# Patient Record
Sex: Male | Born: 1960 | Race: White | Hispanic: No | Marital: Married | Smoking: Former smoker
Health system: Southern US, Community
[De-identification: ages and names within clinical notes are randomized; demographics above are authoritative.]

## PROBLEM LIST (undated history)

## (undated) DIAGNOSIS — C4491 Basal cell carcinoma of skin, unspecified: Secondary | ICD-10-CM

## (undated) DIAGNOSIS — G4700 Insomnia, unspecified: Secondary | ICD-10-CM

## (undated) DIAGNOSIS — J45909 Unspecified asthma, uncomplicated: Secondary | ICD-10-CM

## (undated) DIAGNOSIS — E785 Hyperlipidemia, unspecified: Secondary | ICD-10-CM

## (undated) DIAGNOSIS — R531 Weakness: Secondary | ICD-10-CM

## (undated) DIAGNOSIS — Z87442 Personal history of urinary calculi: Secondary | ICD-10-CM

## (undated) DIAGNOSIS — R350 Frequency of micturition: Secondary | ICD-10-CM

## (undated) DIAGNOSIS — E119 Type 2 diabetes mellitus without complications: Secondary | ICD-10-CM

## (undated) DIAGNOSIS — M542 Cervicalgia: Secondary | ICD-10-CM

## (undated) DIAGNOSIS — M1990 Unspecified osteoarthritis, unspecified site: Secondary | ICD-10-CM

## (undated) DIAGNOSIS — G4730 Sleep apnea, unspecified: Secondary | ICD-10-CM

## (undated) DIAGNOSIS — I10 Essential (primary) hypertension: Secondary | ICD-10-CM

## (undated) DIAGNOSIS — F410 Panic disorder [episodic paroxysmal anxiety] without agoraphobia: Secondary | ICD-10-CM

## (undated) HISTORY — PX: LITHOTRIPSY: SUR834

## (undated) HISTORY — PX: TONSILLECTOMY: SUR1361

## (undated) HISTORY — PX: ARTHROSCOPIC REPAIR ACL: SUR80

## (undated) HISTORY — PX: ANKLE SURGERY: SHX546

---

## 1999-07-12 ENCOUNTER — Encounter: Payer: Self-pay | Admitting: Orthopedic Surgery

## 1999-07-12 ENCOUNTER — Ambulatory Visit (HOSPITAL_COMMUNITY): Admission: RE | Admit: 1999-07-12 | Discharge: 1999-07-12 | Payer: Self-pay | Admitting: Orthopedic Surgery

## 2012-11-10 ENCOUNTER — Emergency Department (HOSPITAL_COMMUNITY): Payer: Worker's Compensation

## 2012-11-10 ENCOUNTER — Inpatient Hospital Stay (HOSPITAL_COMMUNITY)
Admission: EM | Admit: 2012-11-10 | Discharge: 2012-11-16 | DRG: 200 | Disposition: A | Discharge status: Home / Self Care | Payer: Worker's Compensation | Attending: General Surgery | Admitting: General Surgery

## 2012-11-10 ENCOUNTER — Encounter (HOSPITAL_COMMUNITY): Payer: Self-pay

## 2012-11-10 DIAGNOSIS — Y990 Civilian activity done for income or pay: Secondary | ICD-10-CM

## 2012-11-10 DIAGNOSIS — F17200 Nicotine dependence, unspecified, uncomplicated: Secondary | ICD-10-CM | POA: Diagnosis present

## 2012-11-10 DIAGNOSIS — S1093XA Contusion of unspecified part of neck, initial encounter: Secondary | ICD-10-CM | POA: Diagnosis present

## 2012-11-10 DIAGNOSIS — Y92410 Unspecified street and highway as the place of occurrence of the external cause: Secondary | ICD-10-CM

## 2012-11-10 DIAGNOSIS — S2249XA Multiple fractures of ribs, unspecified side, initial encounter for closed fracture: Secondary | ICD-10-CM

## 2012-11-10 DIAGNOSIS — F411 Generalized anxiety disorder: Secondary | ICD-10-CM | POA: Diagnosis present

## 2012-11-10 DIAGNOSIS — IMO0002 Reserved for concepts with insufficient information to code with codable children: Secondary | ICD-10-CM

## 2012-11-10 DIAGNOSIS — T148XXA Other injury of unspecified body region, initial encounter: Secondary | ICD-10-CM

## 2012-11-10 DIAGNOSIS — S2241XA Multiple fractures of ribs, right side, initial encounter for closed fracture: Secondary | ICD-10-CM

## 2012-11-10 DIAGNOSIS — G4733 Obstructive sleep apnea (adult) (pediatric): Secondary | ICD-10-CM | POA: Diagnosis present

## 2012-11-10 DIAGNOSIS — S060X9A Concussion with loss of consciousness of unspecified duration, initial encounter: Secondary | ICD-10-CM

## 2012-11-10 DIAGNOSIS — S272XXA Traumatic hemopneumothorax, initial encounter: Secondary | ICD-10-CM

## 2012-11-10 DIAGNOSIS — Z23 Encounter for immunization: Secondary | ICD-10-CM

## 2012-11-10 DIAGNOSIS — S270XXA Traumatic pneumothorax, initial encounter: Principal | ICD-10-CM | POA: Diagnosis present

## 2012-11-10 DIAGNOSIS — S0003XA Contusion of scalp, initial encounter: Secondary | ICD-10-CM | POA: Diagnosis present

## 2012-11-10 DIAGNOSIS — L259 Unspecified contact dermatitis, unspecified cause: Secondary | ICD-10-CM | POA: Diagnosis present

## 2012-11-10 HISTORY — DX: Sleep apnea, unspecified: G47.30

## 2012-11-10 HISTORY — DX: Unspecified osteoarthritis, unspecified site: M19.90

## 2012-11-10 LAB — URINALYSIS, ROUTINE W REFLEX MICROSCOPIC
Leukocytes, UA: NEGATIVE
Nitrite: NEGATIVE
Protein, ur: 30 mg/dL — AB
Urobilinogen, UA: 0.2 mg/dL (ref 0.0–1.0)

## 2012-11-10 LAB — CBC WITH DIFFERENTIAL/PLATELET
Eosinophils Absolute: 0.2 10*3/uL (ref 0.0–0.7)
HCT: 46.4 % (ref 39.0–52.0)
Hemoglobin: 16.1 g/dL (ref 13.0–17.0)
Lymphs Abs: 1.7 10*3/uL (ref 0.7–4.0)
MCH: 30 pg (ref 26.0–34.0)
MCV: 86.4 fL (ref 78.0–100.0)
Monocytes Absolute: 1.6 10*3/uL — ABNORMAL HIGH (ref 0.1–1.0)
Monocytes Relative: 7 % (ref 3–12)
Neutrophils Relative %: 84 % — ABNORMAL HIGH (ref 43–77)
RBC: 5.37 MIL/uL (ref 4.22–5.81)

## 2012-11-10 LAB — TYPE AND SCREEN
ABO/RH(D): O POS
Antibody Screen: NEGATIVE

## 2012-11-10 LAB — CBC
HCT: 45.7 % (ref 39.0–52.0)
MCH: 29 pg (ref 26.0–34.0)
MCHC: 33.5 g/dL (ref 30.0–36.0)
RDW: 14.1 % (ref 11.5–15.5)

## 2012-11-10 LAB — CK TOTAL AND CKMB (NOT AT ARMC): CK, MB: 4.9 ng/mL — ABNORMAL HIGH (ref 0.3–4.0)

## 2012-11-10 LAB — CREATININE, SERUM: GFR calc non Af Amer: >90 mL/min (ref >90)

## 2012-11-10 LAB — URINE MICROSCOPIC-ADD ON

## 2012-11-10 LAB — POCT I-STAT, CHEM 8
Hemoglobin: 16.7 g/dL (ref 13.0–17.0)
Sodium: 141 mEq/L (ref 135–145)
TCO2: 30 mmol/L (ref 0–100)

## 2012-11-10 LAB — COMPREHENSIVE METABOLIC PANEL
AST: 170 U/L — ABNORMAL HIGH (ref 0–37)
Albumin: 3.9 g/dL (ref 3.5–5.2)
Alkaline Phosphatase: 88 U/L (ref 39–117)
BUN: 17 mg/dL (ref 6–23)
Chloride: 103 mEq/L (ref 96–112)
Potassium: 4.7 mEq/L (ref 3.5–5.1)
Total Bilirubin: 0.2 mg/dL — ABNORMAL LOW (ref 0.3–1.2)

## 2012-11-10 LAB — ETHANOL: Alcohol, Ethyl (B): <11 mg/dL (ref 0–11)

## 2012-11-10 LAB — ABO/RH: ABO/RH(D): O POS

## 2012-11-10 MED ORDER — HYDROCODONE-ACETAMINOPHEN 5-325 MG PO TABS
2.0000 | ORAL_TABLET | ORAL | Status: DC | PRN
  Administered 2012-11-11: 2 via ORAL
  Filled 2012-11-10: qty 2

## 2012-11-10 MED ORDER — ACETAMINOPHEN 325 MG PO TABS: 650.0000 mg | ORAL_TABLET | ORAL | Status: DC | PRN

## 2012-11-10 MED ORDER — MORPHINE SULFATE 4 MG/ML IJ SOLN
4.0000 mg | Freq: Once | INTRAMUSCULAR | Status: AC
  Administered 2012-11-10: 4 mg via INTRAVENOUS
  Filled 2012-11-10 (×2): qty 1

## 2012-11-10 MED ORDER — ENOXAPARIN SODIUM 40 MG/0.4ML ~~LOC~~ SOLN
40.0000 mg | SUBCUTANEOUS | Status: DC
  Administered 2012-11-10: 40 mg via SUBCUTANEOUS
  Filled 2012-11-10 (×2): qty 0.4

## 2012-11-10 MED ORDER — MORPHINE SULFATE 2 MG/ML IJ SOLN
1.0000 mg | INTRAMUSCULAR | Status: DC | PRN
  Administered 2012-11-11: 2 mg via INTRAVENOUS
  Filled 2012-11-10: qty 1

## 2012-11-10 MED ORDER — ONDANSETRON HCL 4 MG/2ML IJ SOLN
4.0000 mg | Freq: Once | INTRAMUSCULAR | Status: AC
  Administered 2012-11-10: 4 mg via INTRAVENOUS
  Filled 2012-11-10: qty 2

## 2012-11-10 MED ORDER — MORPHINE SULFATE 4 MG/ML IJ SOLN
4.0000 mg | Freq: Once | INTRAMUSCULAR | Status: AC
  Administered 2012-11-10: 4 mg via INTRAVENOUS
  Filled 2012-11-10: qty 1

## 2012-11-10 MED ORDER — KCL IN DEXTROSE-NACL 20-5-0.45 MEQ/L-%-% IV SOLN
INTRAVENOUS | Status: DC
  Administered 2012-11-10: 22:00:00 via INTRAVENOUS
  Filled 2012-11-10 (×3): qty 1000

## 2012-11-10 MED ORDER — HYDROCODONE-ACETAMINOPHEN 5-325 MG PO TABS: 1.0000 | ORAL_TABLET | ORAL | Status: DC | PRN

## 2012-11-10 MED ORDER — PANTOPRAZOLE SODIUM 40 MG IV SOLR
40.0000 mg | Freq: Every day | INTRAVENOUS | Status: DC
  Filled 2012-11-10 (×2): qty 40

## 2012-11-10 MED ORDER — ONDANSETRON HCL 4 MG/2ML IJ SOLN
4.0000 mg | Freq: Four times a day (QID) | INTRAMUSCULAR | Status: DC | PRN
Start: 2012-11-10 — End: 2012-11-16

## 2012-11-10 MED ORDER — DOCUSATE SODIUM 100 MG PO CAPS
100.0000 mg | ORAL_CAPSULE | Freq: Two times a day (BID) | ORAL | Status: DC
  Administered 2012-11-10 – 2012-11-14 (×7): 100 mg via ORAL
  Filled 2012-11-10 (×7): qty 1

## 2012-11-10 MED ORDER — IOHEXOL 300 MG/ML  SOLN
80.0000 mL | Freq: Once | INTRAMUSCULAR | Status: AC | PRN
  Administered 2012-11-10: 80 mL via INTRAVENOUS

## 2012-11-10 MED ORDER — PANTOPRAZOLE SODIUM 40 MG PO TBEC
40.0000 mg | DELAYED_RELEASE_TABLET | Freq: Every day | ORAL | Status: DC
  Administered 2012-11-10 – 2012-11-11 (×2): 40 mg via ORAL
  Filled 2012-11-10 (×2): qty 1

## 2012-11-10 MED ORDER — MORPHINE SULFATE 4 MG/ML IJ SOLN: 4.0000 mg | Freq: Once | INTRAMUSCULAR | Status: DC

## 2012-11-10 MED ORDER — SODIUM CHLORIDE 0.9 % IV SOLN
INTRAVENOUS | Status: DC
  Administered 2012-11-10: 16:00:00 via INTRAVENOUS

## 2012-11-10 MED ORDER — ONDANSETRON HCL 4 MG PO TABS: 4.0000 mg | ORAL_TABLET | Freq: Four times a day (QID) | ORAL | Status: DC | PRN

## 2012-11-10 MED ORDER — BISACODYL 10 MG RE SUPP: 10.0000 mg | Freq: Every day | RECTAL | Status: DC | PRN
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
## 2012-11-10 NOTE — ED Notes (Signed)
Pt not requesting any pain medication at the moment. States he is only thirsty.

## 2012-11-10 NOTE — ED Provider Notes (Signed)
Medical screening examination/treatment/procedure(s) were conducted as a shared visit with non-physician practitioner(s) and myself.  I personally evaluated the patient during the encounter   Loren Racer, MD 11/10/12 2303
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
## 2012-11-11 ENCOUNTER — Encounter (HOSPITAL_COMMUNITY): Payer: Self-pay | Admitting: *Deleted

## 2012-11-11 ENCOUNTER — Inpatient Hospital Stay (HOSPITAL_COMMUNITY): Payer: Worker's Compensation

## 2012-11-11 DIAGNOSIS — G4733 Obstructive sleep apnea (adult) (pediatric): Secondary | ICD-10-CM | POA: Insufficient documentation

## 2012-11-11 DIAGNOSIS — S060X9A Concussion with loss of consciousness of unspecified duration, initial encounter: Secondary | ICD-10-CM

## 2012-11-11 DIAGNOSIS — S272XXA Traumatic hemopneumothorax, initial encounter: Secondary | ICD-10-CM

## 2012-11-11 DIAGNOSIS — S2241XA Multiple fractures of ribs, right side, initial encounter for closed fracture: Secondary | ICD-10-CM

## 2012-11-11 LAB — CBC
HCT: 41.3 % (ref 39.0–52.0)
Hemoglobin: 14 g/dL (ref 13.0–17.0)
MCH: 29.3 pg (ref 26.0–34.0)
MCHC: 33.9 g/dL (ref 30.0–36.0)
MCV: 86.4 fL (ref 78.0–100.0)

## 2012-11-11 LAB — BASIC METABOLIC PANEL
BUN: 15 mg/dL (ref 6–23)
CO2: 24 mEq/L (ref 19–32)
Calcium: 8.3 mg/dL — ABNORMAL LOW (ref 8.4–10.5)
Creatinine, Ser: 0.67 mg/dL (ref 0.50–1.35)
GFR calc non Af Amer: >90 mL/min (ref >90)
Glucose, Bld: 206 mg/dL — ABNORMAL HIGH (ref 70–99)
Sodium: 138 mEq/L (ref 135–145)

## 2012-11-11 MED ORDER — ENOXAPARIN SODIUM 30 MG/0.3ML ~~LOC~~ SOLN
30.0000 mg | Freq: Two times a day (BID) | SUBCUTANEOUS | Status: DC
  Administered 2012-11-11 – 2012-11-14 (×6): 30 mg via SUBCUTANEOUS
  Filled 2012-11-11 (×9): qty 0.3

## 2012-11-11 MED ORDER — BACITRACIN ZINC 500 UNIT/GM EX OINT
TOPICAL_OINTMENT | Freq: Two times a day (BID) | CUTANEOUS | Status: DC
  Administered 2012-11-11 – 2012-11-15 (×10): via TOPICAL
  Filled 2012-11-11: qty 15

## 2012-11-11 MED ORDER — GUAIFENESIN ER 600 MG PO TB12
600.0000 mg | ORAL_TABLET | Freq: Two times a day (BID) | ORAL | Status: DC
  Administered 2012-11-11 – 2012-11-14 (×6): 600 mg via ORAL
  Filled 2012-11-11 (×7): qty 1

## 2012-11-11 MED ORDER — HYDROCODONE-ACETAMINOPHEN 10-325 MG PO TABS
0.5000 | ORAL_TABLET | ORAL | Status: DC | PRN
  Administered 2012-11-12 – 2012-11-13 (×3): 1 via ORAL
  Administered 2012-11-13 – 2012-11-14 (×4): 2 via ORAL
  Filled 2012-11-11 (×2): qty 2
  Filled 2012-11-11 (×3): qty 1
  Filled 2012-11-11: qty 2
  Filled 2012-11-11 (×2): qty 1
  Filled 2012-11-11: qty 2
  Filled 2012-11-11: qty 1
  Filled 2012-11-11: qty 2

## 2012-11-11 MED ORDER — LORAZEPAM 1 MG PO TABS
1.0000 mg | ORAL_TABLET | Freq: Three times a day (TID) | ORAL | Status: DC | PRN
  Administered 2012-11-12 – 2012-11-14 (×3): 1 mg via ORAL
  Filled 2012-11-11 (×4): qty 1

## 2012-11-11 MED ORDER — MORPHINE SULFATE 2 MG/ML IJ SOLN
2.0000 mg | INTRAMUSCULAR | Status: DC | PRN
  Administered 2012-11-12 – 2012-11-14 (×3): 2 mg via INTRAVENOUS
  Filled 2012-11-11 (×3): qty 1

## 2012-11-11 MED ORDER — NAPROXEN 500 MG PO TABS
500.0000 mg | ORAL_TABLET | Freq: Two times a day (BID) | ORAL | Status: DC
  Administered 2012-11-11 – 2012-11-16 (×9): 500 mg via ORAL
  Filled 2012-11-11 (×15): qty 1
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
## 2012-11-11 NOTE — Progress Notes (Signed)
Agree.  Wilmon Arms. Corliss Skains, MD, [facility]  11/11/2012 10:52 AM
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
## 2012-11-11 NOTE — Progress Notes (Signed)
Inpatient Diabetes Program Recommendations  AACE/ADA: New Consensus Statement on Inpatient Glycemic Control (2013)  Target Ranges:  Prepandial:   less than 140 mg/dL      Peak postprandial:   less than 180 mg/dL (1-2 hours)      Critically ill patients:  140 - 180 mg/dL   Reason for Assessment: Elevated lab glucose levels.  No history of diabetes noted.  Results for LOYCE, KLASEN (MRN [id number]) as of 11/11/2012 15:44  Ref. Range 11/10/2012 15:52 11/10/2012 16:19 11/11/2012 03:35  Glucose Latest Range: 70-99 mg/dL 409 (H) 811 (H) 914 (H)   Note: Please consider ordering a Hgb A1C and cbg's ac & HS with correction.  Please assess patient for possible new-onset diabetes.  Thank you.  Ruthann Angulo S. Elsie Lincoln, RN, CNS, CDE Inpatient Diabetes Program, team pager [phone]

## 2012-11-11 NOTE — Progress Notes (Signed)
UR completed 

## 2012-11-12 ENCOUNTER — Inpatient Hospital Stay (HOSPITAL_COMMUNITY): Payer: Worker's Compensation

## 2012-11-12 MED ORDER — LIDOCAINE HCL (PF) 1 % IJ SOLN
INTRAMUSCULAR | Status: AC
  Administered 2012-11-12: 11:00:00
  Filled 2012-11-12: qty 5

## 2012-11-12 MED ORDER — MIDAZOLAM HCL 2 MG/2ML IJ SOLN
INTRAMUSCULAR | Status: AC
  Administered 2012-11-12: 2 mg
  Filled 2012-11-12: qty 4

## 2012-11-12 MED ORDER — LIDOCAINE HCL (PF) 1 % IJ SOLN
INTRAMUSCULAR | Status: AC
  Administered 2012-11-12: 10 mg
  Filled 2012-11-12: qty 5

## 2012-11-12 MED ORDER — FENTANYL CITRATE 0.05 MG/ML IJ SOLN
INTRAMUSCULAR | Status: AC
  Administered 2012-11-12: 25 ug
  Filled 2012-11-12: qty 2
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
## 2012-11-12 NOTE — Procedures (Signed)
Right Chest Tube Insertion Procedure Note  Indications:  Clinically significant Pneumothorax  Pre-operative Diagnosis: Pneumothorax  Post-operative Diagnosis: Pneumothorax  Procedure Details  Informed consent was obtained for the procedure, including sedation.  Risks of lung perforation, hemorrhage, arrhythmia, and adverse drug reaction were discussed.   After sterile skin prep, using standard technique with 15mL 1% lidocaine local anesthetic, a 28 French tube was placed in the right anteriolateral below nipple line.  Findings: Good rush of air when entering the pleural space, repeat CXR pending  Estimated Blood Loss:  Minimal        Specimens:  None              Complications:  None; patient tolerated the procedure well.         Disposition: stable to continue ICU         Condition: stable  Attending: Dr. Frederik Schmidt

## 2012-11-12 NOTE — Procedures (Signed)
I was present during the entire procedure.  CT may be in fissure on the right.  This patient has been seen and I agree with the findings and treatment plan.  Marta Lamas. Gae Bon, MD, FACS [phone] (pager) [phone] (direct pager) Trauma Surgeon
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
## 2012-11-12 NOTE — Progress Notes (Signed)
This patient has been seen and I agree with the findings and treatment plan.  Carmilla Granville O. Dalin Caldera, III, MD, FACS [phone] (pager) [phone] (direct pager) Trauma Surgeon  

## 2012-11-13 ENCOUNTER — Inpatient Hospital Stay (HOSPITAL_COMMUNITY): Payer: Worker's Compensation

## 2012-11-13 NOTE — Progress Notes (Signed)
Subjective: Good use of IS  Sore in ribs  Objective: Vital signs in last 24 hours: Temp:  [97.6 F (36.4 C)-99.1 F (37.3 C)] 97.6 F (36.4 C) (03/20 0747) Pulse Rate:  [72-90] 77 (03/20 0747) Resp:  [10-26] 17 (03/20 0747) BP: (107-135)/(58-78) 107/69 mmHg (03/20 0747) SpO2:  [95 %-100 %] 95 % (03/20 0747)    Intake/Output from previous day: 03/19 0701 - 03/20 0700 In: 240 [P.O.:240] Out: 2270 [Urine:2200; Chest Tube:70] Intake/Output this shift:    General appearance: alert Pulmonary:    Diminish BS B   No air leak.  Good air movement. Lab Results:   Recent Labs  11/10/12 2222 11/11/12 0335  WBC 15.0* 12.7*  HGB 15.3 14.0  HCT 45.7 41.3  PLT 208 194   BMET  Recent Labs  11/10/12 1552 11/10/12 1619 11/10/12 2222 11/11/12 0335  NA 138 141  --  138  K 4.7 4.7  --  4.1  CL 103 106  --  105  CO2 27  --   --  24  GLUCOSE 301* 295*  --  206*  BUN 17 18  --  15  CREATININE 0.76 0.90 0.85 0.67  CALCIUM 9.1  --   --  8.3*   PT/INR  Recent Labs  11/10/12 1552  LABPROT 12.2  INR 0.91   ABG No results found for this basename: PHART, PCO2, PO2, HCO3,  in the last 72 hours  Studies/Results: Dg Chest Port 1 View  11/13/2012  *RADIOLOGY REPORT*  Clinical Data: Chest tube evaluation.  PORTABLE CHEST - 1 VIEW  Comparison: Chest x-ray 11/12/2012.  Findings: Right-sided chest tube remains in position with tip inside port over the lower right hemithorax.  Previously noted small right-sided pneumothorax is no longer confidently identified. There are opacities throughout the mid and lower lungs bilaterally which may reflect areas of atelectasis, consolidation or resolving pulmonary contusion.  Small bilateral pleural effusions.  Pulmonary vasculature is within normal limits.  Heart size is borderline enlarged.  Mediastinal contours are distorted by patient positioning.  Subcutaneous emphysema is seen in the right chest wall adjacent to the right chest tube.  IMPRESSION:  1.  Right-sided chest tube appears properly located, and there is now resolution of the previously noted right-sided pneumothorax. The appearance of the chest is otherwise essentially unchanged, as above.   Original Report Authenticated By: Trudie Reed, M.D.    Dg Chest Port 1 View  11/12/2012  *RADIOLOGY REPORT*  Clinical Data: Right chest tube placement.  PORTABLE CHEST - 1 VIEW  Comparison: 11/12/2012.  Findings: Right-sided chest tube has been placed with subcutaneous air right chest wall.  Right apical pneumothorax without significant change approximately 15-20%.  Pulmonary vascular congestion/palmar edema.  Basilar atelectasis.  Prominence of the mediastinal and cardiac silhouette.  IMPRESSION: Right-sided chest tube has been placed with subcutaneous air right chest wall.  Right apical pneumothorax without significant change approximately 15-20%.  Pulmonary vascular congestion/palmar edema.  Basilar atelectasis.  Prominence of the mediastinal and cardiac silhouette.  This is a call report.   Original Report Authenticated By: Lacy Duverney, M.D.    Dg Chest Port 1 View  11/12/2012  *RADIOLOGY REPORT*  Clinical Data: Follow-up evaluation of pneumothorax.  PORTABLE CHEST - 1 VIEW  Comparison: Chest x-ray through 18-1014.  Findings: Slight increase in what is now a moderate sized right- sided pneumothorax (approximately 20% of the volume of the right hemithorax).  Increasing bibasilar opacities (particularly on the right), favored to reflect increasing areas of  atelectasis.  No definite pleural effusions.  Mild pulmonary venous congestion, accentuated by low lung volumes, without frank pulmonary edema. Heart size is normal. The patient is rotated to the left on today's exam, resulting in distortion of the mediastinal contours and reduced diagnostic sensitivity and specificity for mediastinal pathology.  IMPRESSION: 1.  Slight interval increase in size of what is now a moderate sized right-sided  pneumothorax (approximately 20% of the volume of the right hemithorax). 2.  Increasing bibasilar opacities favored to reflect worsening atelectasis, although sequelae of aspiration is also possible.  These results will be called to the ordering clinician or representative by the Radiologist Assistant, and communication documented in the PACS Dashboard.   Original Report Authenticated By: Trudie Reed, M.D.     Anti-infectives: Anti-infectives   None      Assessment/Plan: PHBC  Concussion -- No obvious sequelae  Multiple right rib fractures w/HPTX -- PTX  Multiple abrasions -- Local care  OSA -- on CPAP  FEN -- Tolerating reg diet, SL IV, encourage orals for pain, NSAID  VTE -- SCD's, higher dose Lovenox given weight  Dispo -- CT placed  Lung reexpanded no air leak  To floor     LOS: 3 days    Chania Kochanski A. 11/13/2012

## 2012-11-13 NOTE — Plan of Care (Signed)
Problem: Phase I Progression Outcomes Goal: Pain controlled with appropriate interventions Outcome: Progressing Pain controlled with po analgesia Goal: OOB as tolerated unless otherwise ordered Outcome: Not Progressing Pt is very hesitant to ambulate with continue to encourage Goal: Voiding-avoid urinary catheter unless indicated Outcome: Progressing Pt voiding adaquately Goal: Hemodynamically stable Outcome: Progressing VSS

## 2012-11-14 ENCOUNTER — Inpatient Hospital Stay (HOSPITAL_COMMUNITY): Payer: Worker's Compensation

## 2012-11-14 MED ORDER — TRAMADOL HCL 50 MG PO TABS
50.0000 mg | ORAL_TABLET | Freq: Four times a day (QID) | ORAL | Status: DC | PRN
  Administered 2012-11-14 – 2012-11-15 (×3): 100 mg via ORAL
  Filled 2012-11-14 (×3): qty 2

## 2012-11-14 MED ORDER — HYDROCODONE-ACETAMINOPHEN 10-325 MG PO TABS
2.0000 | ORAL_TABLET | ORAL | Status: DC | PRN
  Administered 2012-11-15 – 2012-11-16 (×2): 2 via ORAL
  Filled 2012-11-14 (×2): qty 2

## 2012-11-14 NOTE — Progress Notes (Signed)
Pt still hurting stating the Ultram "took the edge off" but he's not had much relief. MD on call Cornett made aware, also about rash and d'cd meds earlier today, and orders given to restart Norco.

## 2012-11-14 NOTE — Progress Notes (Signed)
CXR looks okay.  Okay to go to water seal.  This patient has been seen and I agree with the findings and treatment plan.  Marta Lamas. Gae Bon, MD, FACS [phone] (pager) [phone] (direct pager) Trauma Surgeon

## 2012-11-14 NOTE — Progress Notes (Signed)
Patient ID: Edward Santiago, male   DOB: 06/03/1961, 52 y.o.   MRN: [id number]  RN asked me to see pt because of rash on his back. He has a diffuse maculopapular rash over the back and buttocks with some extension onto the flank and hip area with some bullous formation at the right hip. Mildly pruritic, no pain. No prior history of medication allergy. Has had all current medications before except Lovenox and Morphine. Will d/c all current meds except Naproxen (home med) and topical bacitracin. Try tramadol for pain with dilaudid for breakthrough. Monitor rash.   Freeman Caldron, PA-C Pager: [phone] General Trauma PA Pager: [phone]

## 2012-11-14 NOTE — Progress Notes (Signed)
UR completed 

## 2012-11-14 NOTE — Progress Notes (Signed)
Patient ID: JERMERY CARATACHEA, male   DOB: 01-Feb-1961, 52 y.o.   MRN: [id number]    Subjective: Doing well overall, wants to be able to walk more but has chest tube attached to suction, tolerating diet well, sore in ribs and left leg  Objective: Vital signs in last 24 hours: Temp:  [97.7 F (36.5 C)-98.7 F (37.1 C)] 98 F (36.7 C) (03/21 0542) Pulse Rate:  [80-96] 90 (03/21 0542) Resp:  [16-22] 20 (03/21 0542) BP: (115-142)/(69-92) 139/92 mmHg (03/21 0542) SpO2:  [93 %-97 %] 95 % (03/21 0542) Last BM Date: 11/10/12  Intake/Output from previous day: 03/20 0701 - 03/21 0700 In: -  Out: 1520 [Urine:1500; Chest Tube:20] Intake/Output this shift:    General appearance: alert, alert, NAD Heart: RRR Pulmonary: Diminish BS Bilateral   No air leak.  Good air movement. Abd: soft, nontender, +BS   Lab Results:  No results found for this basename: WBC, HGB, HCT, PLT,  in the last 72 hours BMET No results found for this basename: NA, K, CL, CO2, GLUCOSE, BUN, CREATININE, CALCIUM,  in the last 72 hours PT/INR No results found for this basename: LABPROT, INR,  in the last 72 hours ABG No results found for this basename: PHART, PCO2, PO2, HCO3,  in the last 72 hours  Studies/Results: Dg Chest Port 1 View  11/14/2012  *RADIOLOGY REPORT*  Clinical Data: Recent pneumothorax with chest tube placement; pain  PORTABLE CHEST - 1 VIEW  Comparison: November 13, 2012  Findings: Chest tube is positioned on the right with air tracking in the soft tissues along the lateral aspect of the tube, stable. There is no apparent pneumothorax.  Patchy infiltrate in the left base persists.  There is no new opacity.  Heart is upper normal in size with normal pulmonary vascularity.  No adenopathy.  IMPRESSION:   No apparent pneumothorax.  Patchy infiltrate left base, stable.  No new opacity.   Original Report Authenticated By: Bretta Bang, M.D.    Dg Chest Port 1 View  11/13/2012  *RADIOLOGY REPORT*  Clinical  Data: Chest tube evaluation.  PORTABLE CHEST - 1 VIEW  Comparison: Chest x-ray 11/12/2012.  Findings: Right-sided chest tube remains in position with tip inside port over the lower right hemithorax.  Previously noted small right-sided pneumothorax is no longer confidently identified. There are opacities throughout the mid and lower lungs bilaterally which may reflect areas of atelectasis, consolidation or resolving pulmonary contusion.  Small bilateral pleural effusions.  Pulmonary vasculature is within normal limits.  Heart size is borderline enlarged.  Mediastinal contours are distorted by patient positioning.  Subcutaneous emphysema is seen in the right chest wall adjacent to the right chest tube.  IMPRESSION: 1.  Right-sided chest tube appears properly located, and there is now resolution of the previously noted right-sided pneumothorax. The appearance of the chest is otherwise essentially unchanged, as above.   Original Report Authenticated By: Trudie Reed, M.D.    Dg Chest Port 1 View  11/12/2012  *RADIOLOGY REPORT*  Clinical Data: Right chest tube placement.  PORTABLE CHEST - 1 VIEW  Comparison: 11/12/2012.  Findings: Right-sided chest tube has been placed with subcutaneous air right chest wall.  Right apical pneumothorax without significant change approximately 15-20%.  Pulmonary vascular congestion/palmar edema.  Basilar atelectasis.  Prominence of the mediastinal and cardiac silhouette.  IMPRESSION: Right-sided chest tube has been placed with subcutaneous air right chest wall.  Right apical pneumothorax without significant change approximately 15-20%.  Pulmonary vascular congestion/palmar edema.  Basilar  atelectasis.  Prominence of the mediastinal and cardiac silhouette.  This is a call report.   Original Report Authenticated By: Lacy Duverney, M.D.     Anti-infectives: Anti-infectives   None      Assessment/Plan: PHBC  Concussion -- No obvious sequelae  Multiple right rib fractures  w/HPTX -- PTX, x-ray today and yesterday showed resolution of the PTX, will go to water seal today and recheck cxr tomorrow  Multiple abrasions -- Local care  OSA -- on CPAP  FEN -- Tolerating reg diet, SL IV, encourage orals for pain, NSAID  VTE -- SCD's, higher dose Lovenox given weight  Dispo -- PT eval to address any needs, water seal today, ?d/c CT tomorrow, d/c home within the next 24-48 hours.     LOS: 4 days    Otelia Hettinger 11/14/2012

## 2012-11-15 ENCOUNTER — Inpatient Hospital Stay (HOSPITAL_COMMUNITY): Payer: Worker's Compensation

## 2012-11-15 MED ORDER — PSEUDOEPHEDRINE HCL 30 MG PO TABS
30.0000 mg | ORAL_TABLET | Freq: Three times a day (TID) | ORAL | Status: DC | PRN
  Administered 2012-11-15: 30 mg via ORAL
  Filled 2012-11-15 (×2): qty 1

## 2012-11-15 MED ORDER — DIPHENHYDRAMINE HCL 25 MG PO CAPS
25.0000 mg | ORAL_CAPSULE | ORAL | Status: DC | PRN
  Administered 2012-11-15 – 2012-11-16 (×2): 25 mg via ORAL
  Filled 2012-11-15 (×3): qty 1

## 2012-11-15 MED ORDER — DIPHENHYDRAMINE HCL 25 MG PO CAPS
25.0000 mg | ORAL_CAPSULE | Freq: Once | ORAL | Status: AC
  Administered 2012-11-15: 25 mg via ORAL

## 2012-11-15 NOTE — Progress Notes (Signed)
  Subjective: Feeling fine.  Doing well on IS  Objective: Vital signs in last 24 hours: Temp:  [97.9 F (36.6 C)-98.9 F (37.2 C)] 97.9 F (36.6 C) (03/22 0658) Pulse Rate:  [81-100] 81 (03/22 0658) Resp:  [18-20] 18 (03/22 0658) BP: (120-133)/(67-85) 128/85 mmHg (03/22 0658) SpO2:  [94 %-98 %] 98 % (03/22 0658) Last BM Date: 11/10/12  Intake/Output from previous day: 03/21 0701 - 03/22 0700 In: 1000 [P.O.:1000] Out: 1350 [Urine:1250; Chest Tube:100] Intake/Output this shift:    General appearance: alert, cooperative and no distress Resp: clear to auscultation bilaterally and sounds clear today and of serous output from CT, no leak, CXR without ptx, removed at bedside and tolerated well Cardio: normal rate regular GI: soft, non-tender; bowel sounds normal; no masses,  no organomegaly Erythematous and raised rash on back and posterior legs Lab Results:  No results found for this basename: WBC, HGB, HCT, PLT,  in the last 72 hours BMET No results found for this basename: NA, K, CL, CO2, GLUCOSE, BUN, CREATININE, CALCIUM,  in the last 72 hours PT/INR No results found for this basename: LABPROT, INR,  in the last 72 hours ABG No results found for this basename: PHART, PCO2, PO2, HCO3,  in the last 72 hours  Studies/Results: Dg Chest Port 1 View  11/15/2012  *RADIOLOGY REPORT*  Clinical Data: Pneumothorax  PORTABLE CHEST - 1 VIEW  Comparison:   the previous day's study  Findings: Chest remains in the right lung base.  No pneumothorax. Interval decrease in the lateral subcutaneous emphysema.  Bibasilar atelectasis or consolidation, slightly increased on the right since previous exam.  No effusion.  Heart size normal.  IMPRESSION:  1.  Stable right chest tube with no pneumothorax. 2.  Bibasilar atelectasis/consolidation, increased on the right since previous exam.   Original Report Authenticated By: D. Andria Rhein, MD    Dg Chest Port 1 View  11/14/2012  *RADIOLOGY REPORT*   Clinical Data: Recent pneumothorax with chest tube placement; pain  PORTABLE CHEST - 1 VIEW  Comparison: November 13, 2012  Findings: Chest tube is positioned on the right with air tracking in the soft tissues along the lateral aspect of the tube, stable. There is no apparent pneumothorax.  Patchy infiltrate in the left base persists.  There is no new opacity.  Heart is upper normal in size with normal pulmonary vascularity.  No adenopathy.  IMPRESSION:   No apparent pneumothorax.  Patchy infiltrate left base, stable.  No new opacity.   Original Report Authenticated By: Bretta Bang, M.D.     Anti-infectives: Anti-infectives   None      Assessment/Plan: s/p * No surgery found * doing great with IS, CT removed.  will check f/u cxr and likely discharge tomorrow.  he also has rash on back and legs which is in the area of contact with the sheets/bed, probably a contact dermatitis  LOS: 5 days    Lodema Pilot DAVID 11/15/2012

## 2012-11-16 MED ORDER — HYDROCODONE-ACETAMINOPHEN 10-325 MG PO TABS: 1.0000 | ORAL_TABLET | ORAL | Status: DC | PRN

## 2012-11-16 NOTE — Progress Notes (Signed)
  Subjective: "Ready to go home", hasnt had pain meds since last night  Objective: Vital signs in last 24 hours: Temp:  [98.2 F (36.8 C)-98.7 F (37.1 C)] 98.2 F (36.8 C) (03/23 0549) Pulse Rate:  [79-85] 83 (03/23 0549) Resp:  [18-20] 18 (03/23 0549) BP: (116-135)/(70-77) 128/77 mmHg (03/23 0549) SpO2:  [95 %] 95 % (03/23 0549) Last BM Date: 11/10/12  Intake/Output from previous day:   Intake/Output this shift:    General appearance: alert, cooperative and no distress Resp: clear to auscultation bilaterally Cardio: normal rate Skin: rash on backside minimally changed, not worse  Lab Results:  No results found for this basename: WBC, HGB, HCT, PLT,  in the last 72 hours BMET No results found for this basename: NA, K, CL, CO2, GLUCOSE, BUN, CREATININE, CALCIUM,  in the last 72 hours PT/INR No results found for this basename: LABPROT, INR,  in the last 72 hours ABG No results found for this basename: PHART, PCO2, PO2, HCO3,  in the last 72 hours  Studies/Results: Dg Chest 2 View  11/15/2012  *RADIOLOGY REPORT*  Clinical Data: Chest tube removal  CHEST - 2 VIEW  Comparison: 11/15/2012  Findings: Right chest tube has been removed.  No pneumothorax. There is gas in the chest wall on the right, unchanged.  Improved lung volume.  There remains bibasilar atelectasis with slight improvement.  No heart failure or effusion.  IMPRESSION: No pneumothorax post right chest tube removal.  Improved aeration in the lungs.   Original Report Authenticated By: Janeece Riggers, M.D.    Dg Knee 1-2 Views Left  11/15/2012  *RADIOLOGY REPORT*  Clinical Data: Pain  LEFT KNEE - 1-2 VIEW  Comparison: None.  Findings: Normal alignment and no fracture.  Medial and lateral joint spaces are normal.  There is mild spurring of the superior pole patella.  There is a joint effusion.  IMPRESSION: Joint effusion without fracture.   Original Report Authenticated By: Janeece Riggers, M.D.    Dg Chest Port 1  View  11/15/2012  *RADIOLOGY REPORT*  Clinical Data: Pneumothorax  PORTABLE CHEST - 1 VIEW  Comparison:   the previous day's study  Findings: Chest remains in the right lung base.  No pneumothorax. Interval decrease in the lateral subcutaneous emphysema.  Bibasilar atelectasis or consolidation, slightly increased on the right since previous exam.  No effusion.  Heart size normal.  IMPRESSION:  1.  Stable right chest tube with no pneumothorax. 2.  Bibasilar atelectasis/consolidation, increased on the right since previous exam.   Original Report Authenticated By: D. Andria Rhein, MD     Anti-infectives: Anti-infectives   None      Assessment/Plan: s/p * No surgery found * I think that he should be okay for discharge.  xray looks okay, breathing well, no SOB. pain controlled.  he should be okay for discharge with repeat CXR and f/u with primary care doc for apparent contact dermatitis  LOS: 6 days    Lodema Pilot DAVID 11/16/2012

## 2012-11-16 NOTE — Progress Notes (Signed)
Patient places CPAP on himself. Tolerating well at this time. RT will continue to monitor.
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
## 2012-11-27 ENCOUNTER — Telehealth (HOSPITAL_COMMUNITY): Payer: Self-pay | Admitting: Emergency Medicine

## 2012-11-27 DIAGNOSIS — S270XXD Traumatic pneumothorax, subsequent encounter: Secondary | ICD-10-CM

## 2012-11-27 NOTE — Telephone Encounter (Signed)
Ordered CXR for pt.

## 2012-11-28 ENCOUNTER — Ambulatory Visit (HOSPITAL_BASED_OUTPATIENT_CLINIC_OR_DEPARTMENT_OTHER)
Admission: RE | Admit: 2012-11-28 | Discharge: 2012-11-28 | Disposition: A | Discharge status: Home / Self Care | Payer: Worker's Compensation | Source: Ambulatory Visit | Attending: Orthopedic Surgery | Admitting: Orthopedic Surgery

## 2012-11-28 DIAGNOSIS — J90 Pleural effusion, not elsewhere classified: Secondary | ICD-10-CM | POA: Insufficient documentation

## 2012-11-28 DIAGNOSIS — S270XXD Traumatic pneumothorax, subsequent encounter: Secondary | ICD-10-CM

## 2012-12-01 ENCOUNTER — Telehealth (INDEPENDENT_AMBULATORY_CARE_PROVIDER_SITE_OTHER): Payer: Self-pay | Admitting: Orthopedic Surgery

## 2012-12-01 NOTE — Telephone Encounter (Signed)
Left message about CXR results.

## 2013-06-13 ENCOUNTER — Encounter (HOSPITAL_BASED_OUTPATIENT_CLINIC_OR_DEPARTMENT_OTHER): Payer: Self-pay | Admitting: Emergency Medicine

## 2013-06-13 ENCOUNTER — Emergency Department (HOSPITAL_BASED_OUTPATIENT_CLINIC_OR_DEPARTMENT_OTHER)
Admission: EM | Admit: 2013-06-13 | Discharge: 2013-06-13 | Disposition: A | Discharge status: Home / Self Care | Payer: BC Managed Care – PPO | Attending: Emergency Medicine | Admitting: Emergency Medicine

## 2013-06-13 DIAGNOSIS — Z791 Long term (current) use of non-steroidal anti-inflammatories (NSAID): Secondary | ICD-10-CM | POA: Insufficient documentation

## 2013-06-13 DIAGNOSIS — Z8781 Personal history of (healed) traumatic fracture: Secondary | ICD-10-CM | POA: Insufficient documentation

## 2013-06-13 DIAGNOSIS — F17200 Nicotine dependence, unspecified, uncomplicated: Secondary | ICD-10-CM | POA: Insufficient documentation

## 2013-06-13 DIAGNOSIS — M129 Arthropathy, unspecified: Secondary | ICD-10-CM | POA: Insufficient documentation

## 2013-06-13 DIAGNOSIS — R7309 Other abnormal glucose: Secondary | ICD-10-CM | POA: Insufficient documentation

## 2013-06-13 DIAGNOSIS — N390 Urinary tract infection, site not specified: Secondary | ICD-10-CM | POA: Insufficient documentation

## 2013-06-13 DIAGNOSIS — Z8709 Personal history of other diseases of the respiratory system: Secondary | ICD-10-CM | POA: Insufficient documentation

## 2013-06-13 LAB — URINALYSIS, ROUTINE W REFLEX MICROSCOPIC
Glucose, UA: NEGATIVE mg/dL
Nitrite: NEGATIVE
Specific Gravity, Urine: 1.027 (ref 1.005–1.030)
pH: 5 (ref 5.0–8.0)

## 2013-06-13 LAB — GLUCOSE, CAPILLARY: Glucose-Capillary: 192 mg/dL — ABNORMAL HIGH (ref 70–99)

## 2013-06-13 LAB — URINE MICROSCOPIC-ADD ON

## 2013-06-13 MED ORDER — FLUCONAZOLE 200 MG PO TABS: 200.0000 mg | ORAL_TABLET | Freq: Every day | ORAL | Status: DC

## 2013-06-13 MED ORDER — CEPHALEXIN 500 MG PO CAPS: 500.0000 mg | ORAL_CAPSULE | Freq: Two times a day (BID) | ORAL | Status: DC
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
## 2013-06-13 NOTE — ED Notes (Signed)
Pt having lower abdominal pain with hematuria for several days.  No N/V/D.  No known fever.

## 2013-06-15 LAB — URINE CULTURE

## 2013-06-28 NOTE — ED Provider Notes (Addendum)
I saw and evaluated the patient, reviewed the resident's note and I agree with the findings and plan.  EKG Interpretation   None       Hurman Horn, MD 06/28/13 1904  Hurman Horn, MD 06/28/13 [id number]

## 2014-11-25 ENCOUNTER — Other Ambulatory Visit: Payer: Self-pay | Admitting: Orthopedic Surgery

## 2014-12-01 ENCOUNTER — Other Ambulatory Visit: Payer: Self-pay | Admitting: Orthopedic Surgery

## 2014-12-08 ENCOUNTER — Encounter (HOSPITAL_COMMUNITY)
Admission: RE | Admit: 2014-12-08 | Discharge: 2014-12-08 | Disposition: A | Discharge status: Home / Self Care | Payer: No Typology Code available for payment source | Source: Ambulatory Visit | Attending: Orthopedic Surgery | Admitting: Orthopedic Surgery

## 2014-12-08 ENCOUNTER — Encounter (HOSPITAL_COMMUNITY): Payer: Self-pay

## 2014-12-08 DIAGNOSIS — Z01812 Encounter for preprocedural laboratory examination: Secondary | ICD-10-CM | POA: Insufficient documentation

## 2014-12-08 DIAGNOSIS — Z01818 Encounter for other preprocedural examination: Secondary | ICD-10-CM | POA: Insufficient documentation

## 2014-12-08 DIAGNOSIS — Z01810 Encounter for preprocedural cardiovascular examination: Secondary | ICD-10-CM | POA: Insufficient documentation

## 2014-12-08 HISTORY — DX: Hyperlipidemia, unspecified: E78.5

## 2014-12-08 HISTORY — DX: Type 2 diabetes mellitus without complications: E11.9

## 2014-12-08 HISTORY — DX: Panic disorder (episodic paroxysmal anxiety): F41.0

## 2014-12-08 HISTORY — DX: Unspecified asthma, uncomplicated: J45.909

## 2014-12-08 HISTORY — DX: Insomnia, unspecified: G47.00

## 2014-12-08 HISTORY — DX: Weakness: R53.1

## 2014-12-08 HISTORY — DX: Frequency of micturition: R35.0

## 2014-12-08 HISTORY — DX: Cervicalgia: M54.2

## 2014-12-08 HISTORY — DX: Personal history of urinary calculi: Z87.442

## 2014-12-08 LAB — COMPREHENSIVE METABOLIC PANEL
ALBUMIN: 3.8 g/dL (ref 3.5–5.2)
ALK PHOS: 58 U/L (ref 39–117)
ALT: 34 U/L (ref 0–53)
AST: 24 U/L (ref 0–37)
Anion gap: 8 (ref 5–15)
BILIRUBIN TOTAL: 0.4 mg/dL (ref 0.3–1.2)
BUN: 18 mg/dL (ref 6–23)
CHLORIDE: 105 mmol/L (ref 96–112)
CO2: 24 mmol/L (ref 19–32)
CREATININE: 0.85 mg/dL (ref 0.50–1.35)
Calcium: 9.2 mg/dL (ref 8.4–10.5)
GFR calc Af Amer: >90 mL/min (ref >90)
Glucose, Bld: 163 mg/dL — ABNORMAL HIGH (ref 70–99)
Potassium: 4.3 mmol/L (ref 3.5–5.1)
Sodium: 137 mmol/L (ref 135–145)
Total Protein: 6.5 g/dL (ref 6.0–8.3)

## 2014-12-08 LAB — CBC WITH DIFFERENTIAL/PLATELET
BASOS ABS: 0.1 10*3/uL (ref 0.0–0.1)
Basophils Relative: 1 % (ref 0–1)
EOS PCT: 4 % (ref 0–5)
Eosinophils Absolute: 0.3 10*3/uL (ref 0.0–0.7)
HEMATOCRIT: 43.8 % (ref 39.0–52.0)
Hemoglobin: 14.5 g/dL (ref 13.0–17.0)
Lymphocytes Relative: 28 % (ref 12–46)
Lymphs Abs: 2.2 10*3/uL (ref 0.7–4.0)
MCH: 29 pg (ref 26.0–34.0)
MCHC: 33.1 g/dL (ref 30.0–36.0)
MCV: 87.6 fL (ref 78.0–100.0)
Monocytes Absolute: 0.9 10*3/uL (ref 0.1–1.0)
Monocytes Relative: 11 % (ref 3–12)
NEUTROS ABS: 4.6 10*3/uL (ref 1.7–7.7)
Neutrophils Relative %: 56 % (ref 43–77)
PLATELETS: 213 10*3/uL (ref 150–400)
RBC: 5 MIL/uL (ref 4.22–5.81)
RDW: 13 % (ref 11.5–15.5)
WBC: 8.1 10*3/uL (ref 4.0–10.5)

## 2014-12-08 LAB — URINALYSIS, ROUTINE W REFLEX MICROSCOPIC
BILIRUBIN URINE: NEGATIVE
Glucose, UA: NEGATIVE mg/dL
Hgb urine dipstick: NEGATIVE
KETONES UR: NEGATIVE mg/dL
Leukocytes, UA: NEGATIVE
NITRITE: NEGATIVE
PH: 7.5 (ref 5.0–8.0)
PROTEIN: NEGATIVE mg/dL
Specific Gravity, Urine: 1.015 (ref 1.005–1.030)
Urobilinogen, UA: 0.2 mg/dL (ref 0.0–1.0)

## 2014-12-08 LAB — SURGICAL PCR SCREEN
MRSA, PCR: NEGATIVE
STAPHYLOCOCCUS AUREUS: POSITIVE — AB

## 2014-12-08 LAB — PROTIME-INR
INR: 0.88 (ref 0.00–1.49)
Prothrombin Time: 12 seconds (ref 11.6–15.2)

## 2014-12-08 LAB — APTT: aPTT: 27 seconds (ref 24–37)

## 2014-12-08 MED ORDER — POVIDONE-IODINE 7.5 % EX SOLN: Freq: Once | CUTANEOUS | Status: DC

## 2014-12-08 NOTE — Progress Notes (Addendum)
Medical Md is Dr.John Wendie Agreste  Denies ever having an Echo  Denies ever having a Stress test  Denies ever having a Heart cath  Denies EKG or CXR in past yr  Pt doesn't have a Cardiologist  Takes Lisinopril to protect kidneys not for HTN

## 2014-12-08 NOTE — Pre-Procedure Instructions (Signed)
Edward Santiago  12/08/2014   Your procedure is scheduled on:  Wed, April 20 @ 11:00 AM  Report to [facility]  at 8:00 AM.  Call this number if you have problems the morning of surgery: [phone]   Remember:   Do not eat food or drink liquids after midnight.                Stop taking your Diclofenac. No Goody's,BC's,Aleve,Aspirin,Ibuprofen,Fish Oil,or any Herbal Medications.    Do not wear jewelry.  Do not wear lotions, powders, or colognes. You may wear deodorant.  Men may shave face and neck.  Do not bring valuables to the hospital.  [facility] is not responsible                  for any belongings or valuables.               Contacts, dentures or bridgework may not be worn into surgery.  Leave suitcase in the car. After surgery it may be brought to your room.  For patients admitted to the hospital, discharge time is determined by your                treatment team.                 Special Instructions:  [place] - Preparing for Surgery  Before surgery, you can play an important role.  Because skin is not sterile, your skin needs to be as free of germs as possible.  You can reduce the number of germs on you skin by washing with CHG (chlorahexidine gluconate) soap before surgery.  CHG is an antiseptic cleaner which kills germs and bonds with the skin to continue killing germs even after washing.  Please DO NOT use if you have an allergy to CHG or antibacterial soaps.  If your skin becomes reddened/irritated stop using the CHG and inform your nurse when you arrive at Short Stay.  Do not shave (including legs and underarms) for at least 48 hours prior to the first CHG shower.  You may shave your face.  Please follow these instructions carefully:   1.  Shower with CHG Soap the night before surgery and the                                morning of Surgery.  2.  If you choose to wash your hair, wash your hair first as usual with your       normal shampoo.  3.   After you shampoo, rinse your hair and body thoroughly to remove the                      Shampoo.  4.  Use CHG as you would any other liquid soap.  You can apply chg directly       to the skin and wash gently with scrungie or a clean washcloth.  5.  Apply the CHG Soap to your body ONLY FROM THE NECK DOWN.        Do not use on open wounds or open sores.  Avoid contact with your eyes,       ears, mouth and genitals (private parts).  Wash genitals (private parts)       with your normal soap.  6.  Wash thoroughly, paying special attention to the area where your surgery  will be performed.  7.  Thoroughly rinse your body with warm water from the neck down.  8.  DO NOT shower/wash with your normal soap after using and rinsing off       the CHG Soap.  9.  Pat yourself dry with a clean towel.            10.  Wear clean pajamas.            11.  Place clean sheets on your bed the night of your first shower and do not        sleep with pets.  Day of Surgery  Do not apply any lotions/deoderants the morning of surgery.  Please wear clean clothes to the hospital/surgery center.     Please read over the following fact sheets that you were given: Pain Booklet, Coughing and Deep Breathing, MRSA Information and Surgical Site Infection Prevention

## 2014-12-09 NOTE — Progress Notes (Signed)
I called a prescription for Mupirocin ointment to [facility], [place], [place].

## 2014-12-14 MED ORDER — CEFAZOLIN SODIUM-DEXTROSE 2-3 GM-% IV SOLR
2.0000 g | INTRAVENOUS | Status: AC
Start: 2014-12-15 — End: 2014-12-15
  Administered 2014-12-15: 2 g via INTRAVENOUS
  Filled 2014-12-14: qty 50

## 2014-12-14 NOTE — Progress Notes (Signed)
Left message with [facility] and requested that they call patient and advise them of new arrival time per MD as MD request different arrival times that protocol.

## 2014-12-14 NOTE — H&P (Signed)
PREOPERATIVE H&P  Chief Complaint: right arm pain  HPI: Edward Santiago is a 54 y.o. 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 Sleep apnea   . Arthritis   . Hyperlipidemia     takes Atorvastatin daily  . Insomnia     takes Trazodone at bedtime  . Diabetes mellitus without complication     takes Metformin and Amaryl daily  . Asthma     a "touch"  . Weakness     and numbness right hand  . Neck pain   . Urinary frequency   . History of kidney stones   . Panic attacks     but doesn't take any meds   Past Surgical History  Procedure Laterality Date  . Ankle surgery Left   . Arthroscopic repair acl Left   . Tonsillectomy      as a child  . Lithotripsy     History   Social History  . Marital Status: Married    Spouse Name: N/A  . Number of Children: N/A  . Years of Education: N/A   Social History Main Topics  . Smoking status: Current Some Day Smoker -- 25 years  . Smokeless tobacco: Not on file     Comment: smokes 1 cig a day  . Alcohol Use: No  . Drug Use: No  . Sexual Activity: Yes   Other Topics Concern  . Not on file   Social History Narrative   No family history on file. No Known Allergies Prior to Admission medications   Medication Sig Start Date End Date Taking? Authorizing Provider  atorvastatin (LIPITOR) 20 MG tablet Take 20 mg by mouth daily.   Yes Historical Provider, MD  diclofenac (VOLTAREN) 75 MG EC tablet Take 75 mg by mouth 2 (two) times daily.   Yes Historical Provider, MD  glimepiride (AMARYL) 1 MG tablet Take 1 mg by mouth daily with breakfast.   Yes Historical Provider, MD  lisinopril (PRINIVIL,ZESTRIL) 10 MG tablet Take 10 mg by mouth daily.   Yes Historical Provider, MD    metFORMIN (GLUCOPHAGE) 500 MG tablet Take 750 mg by mouth 3 (three) times daily.   Yes Historical Provider, MD  methocarbamol (ROBAXIN) 750 MG tablet Take 750 mg by mouth 2 (two) times daily.   Yes Historical Provider, MD  Multiple Vitamins-Minerals (MULTIVITAMIN WITH MINERALS) tablet Take 1 tablet by mouth daily.   Yes Historical Provider, MD  terbinafine (LAMISIL) 250 MG tablet Take 250 mg by mouth daily.   Yes Historical Provider, MD  TRAZODONE HCL PO Take 1 tablet by mouth at bedtime.   Yes Historical Provider, MD  varenicline (CHANTIX) 1 MG tablet Take 1 mg by mouth 2 (two) times daily.   Yes Historical Provider, MD  cephALEXin (KEFLEX) 500 MG capsule Take 1 capsule (500 mg total) by mouth 2 (two) times daily. Patient not taking: Reported on 12/06/2014 06/13/13   Leone Haven, MD  fluconazole (DIFLUCAN) 200 MG tablet Take 1 tablet (200 mg total) by mouth daily. Patient not taking: Reported on 12/06/2014 06/13/13   Leone Haven, MD     All other systems have been reviewed and were otherwise negative with the exception of those mentioned in the HPI  and as above.  Physical Exam: There were no vitals filed for this visit.  General: Alert, no acute distress Cardiovascular: No pedal edema Respiratory: No cyanosis, no use of accessory musculature Skin: No lesions in the area of chief complaint Neurologic: Sensation intact distally Psychiatric: Patient is competent for consent with normal mood and affect Lymphatic: No axillary or cervical lymphadenopathy  MUSCULOSKELETAL: + spurling on right  Assessment/Plan: Radiculopathy Plan for Procedure(s): ANTERIOR CERVICAL DECOMPRESSION/DISCECTOMY FUSION 2 LEVELS   Sinclair Ship, MD 12/14/2014 7:59 PM

## 2014-12-15 ENCOUNTER — Ambulatory Visit (HOSPITAL_COMMUNITY): Payer: No Typology Code available for payment source

## 2014-12-15 ENCOUNTER — Ambulatory Visit (HOSPITAL_COMMUNITY): Payer: No Typology Code available for payment source | Admitting: Anesthesiology

## 2014-12-15 ENCOUNTER — Encounter (HOSPITAL_COMMUNITY): Payer: Self-pay | Admitting: *Deleted

## 2014-12-15 ENCOUNTER — Encounter (HOSPITAL_COMMUNITY)
Admission: RE | Disposition: A | Discharge status: Home / Self Care | Payer: No Typology Code available for payment source | Source: Ambulatory Visit | Attending: Orthopedic Surgery

## 2014-12-15 ENCOUNTER — Observation Stay (HOSPITAL_COMMUNITY)
Admission: RE | Admit: 2014-12-15 | Discharge: 2014-12-16 | Disposition: A | Discharge status: Home / Self Care | Payer: No Typology Code available for payment source | Source: Ambulatory Visit | Attending: Orthopedic Surgery | Admitting: Orthopedic Surgery

## 2014-12-15 DIAGNOSIS — E785 Hyperlipidemia, unspecified: Secondary | ICD-10-CM | POA: Insufficient documentation

## 2014-12-15 DIAGNOSIS — M4802 Spinal stenosis, cervical region: Secondary | ICD-10-CM | POA: Diagnosis not present

## 2014-12-15 DIAGNOSIS — Z87442 Personal history of urinary calculi: Secondary | ICD-10-CM | POA: Diagnosis not present

## 2014-12-15 DIAGNOSIS — M5412 Radiculopathy, cervical region: Secondary | ICD-10-CM | POA: Diagnosis not present

## 2014-12-15 DIAGNOSIS — J45909 Unspecified asthma, uncomplicated: Secondary | ICD-10-CM | POA: Diagnosis not present

## 2014-12-15 DIAGNOSIS — Z79899 Other long term (current) drug therapy: Secondary | ICD-10-CM | POA: Diagnosis not present

## 2014-12-15 DIAGNOSIS — F17210 Nicotine dependence, cigarettes, uncomplicated: Secondary | ICD-10-CM | POA: Diagnosis not present

## 2014-12-15 DIAGNOSIS — Z23 Encounter for immunization: Secondary | ICD-10-CM | POA: Insufficient documentation

## 2014-12-15 DIAGNOSIS — G4730 Sleep apnea, unspecified: Secondary | ICD-10-CM | POA: Diagnosis not present

## 2014-12-15 DIAGNOSIS — Z419 Encounter for procedure for purposes other than remedying health state, unspecified: Secondary | ICD-10-CM

## 2014-12-15 DIAGNOSIS — E119 Type 2 diabetes mellitus without complications: Secondary | ICD-10-CM | POA: Diagnosis not present

## 2014-12-15 DIAGNOSIS — G4700 Insomnia, unspecified: Secondary | ICD-10-CM | POA: Insufficient documentation

## 2014-12-15 DIAGNOSIS — M5410 Radiculopathy, site unspecified: Secondary | ICD-10-CM | POA: Diagnosis present

## 2014-12-15 HISTORY — PX: ANTERIOR CERVICAL DECOMP/DISCECTOMY FUSION: SHX1161

## 2014-12-15 LAB — GLUCOSE, CAPILLARY
GLUCOSE-CAPILLARY: 179 mg/dL — AB (ref 70–99)
Glucose-Capillary: 119 mg/dL — ABNORMAL HIGH (ref 70–99)
Glucose-Capillary: 160 mg/dL — ABNORMAL HIGH (ref 70–99)

## 2014-12-15 SURGERY — ANTERIOR CERVICAL DECOMPRESSION/DISCECTOMY FUSION 2 LEVELS
Anesthesia: General | Site: Spine Cervical

## 2014-12-15 MED ORDER — HYDROMORPHONE HCL 1 MG/ML IJ SOLN
0.2500 mg | INTRAMUSCULAR | Status: DC | PRN
  Administered 2014-12-15: 0.5 mg via INTRAVENOUS
  Administered 2014-12-15 (×2): 0.25 mg via INTRAVENOUS

## 2014-12-15 MED ORDER — OXYCODONE-ACETAMINOPHEN 5-325 MG PO TABS
1.0000 | ORAL_TABLET | ORAL | Status: DC | PRN
  Administered 2014-12-15 – 2014-12-16 (×3): 2 via ORAL
  Filled 2014-12-15 (×3): qty 2

## 2014-12-15 MED ORDER — ACETAMINOPHEN 650 MG RE SUPP: 650.0000 mg | RECTAL | Status: DC | PRN

## 2014-12-15 MED ORDER — GLIMEPIRIDE 1 MG PO TABS
1.0000 mg | ORAL_TABLET | Freq: Every day | ORAL | Status: DC
  Filled 2014-12-15 (×2): qty 1

## 2014-12-15 MED ORDER — EPHEDRINE SULFATE 50 MG/ML IJ SOLN
INTRAMUSCULAR | Status: DC | PRN
  Administered 2014-12-15: 10 mg via INTRAVENOUS
  Administered 2014-12-15: 15 mg via INTRAVENOUS
  Administered 2014-12-15: 10 mg via INTRAVENOUS

## 2014-12-15 MED ORDER — HEMOSTATIC AGENTS (NO CHARGE) OPTIME
TOPICAL | Status: DC | PRN
  Administered 2014-12-15: 1 via TOPICAL

## 2014-12-15 MED ORDER — PROMETHAZINE HCL 25 MG/ML IJ SOLN: 6.2500 mg | INTRAMUSCULAR | Status: DC | PRN

## 2014-12-15 MED ORDER — LACTATED RINGERS IV SOLN
INTRAVENOUS | Status: DC
  Administered 2014-12-15: 11:00:00 via INTRAVENOUS

## 2014-12-15 MED ORDER — ALUM & MAG HYDROXIDE-SIMETH 200-200-20 MG/5ML PO SUSP: 30.0000 mL | Freq: Four times a day (QID) | ORAL | Status: DC | PRN

## 2014-12-15 MED ORDER — ONDANSETRON HCL 4 MG/2ML IJ SOLN
INTRAMUSCULAR | Status: DC | PRN
  Administered 2014-12-15: 4 mg via INTRAVENOUS

## 2014-12-15 MED ORDER — DIAZEPAM 5 MG PO TABS
5.0000 mg | ORAL_TABLET | Freq: Four times a day (QID) | ORAL | Status: DC | PRN
  Administered 2014-12-15 – 2014-12-16 (×3): 5 mg via ORAL
  Filled 2014-12-15 (×2): qty 1

## 2014-12-15 MED ORDER — DOCUSATE SODIUM 100 MG PO CAPS
100.0000 mg | ORAL_CAPSULE | Freq: Two times a day (BID) | ORAL | Status: DC
  Administered 2014-12-15: 100 mg via ORAL
  Filled 2014-12-15: qty 1

## 2014-12-15 MED ORDER — LACTATED RINGERS IV SOLN
INTRAVENOUS | Status: DC | PRN
  Administered 2014-12-15 (×3): via INTRAVENOUS

## 2014-12-15 MED ORDER — FENTANYL CITRATE (PF) 250 MCG/5ML IJ SOLN
INTRAMUSCULAR | Status: AC
  Filled 2014-12-15: qty 5

## 2014-12-15 MED ORDER — PNEUMOCOCCAL VAC POLYVALENT 25 MCG/0.5ML IJ INJ
0.5000 mL | INJECTION | INTRAMUSCULAR | Status: AC
  Administered 2014-12-16: 0.5 mL via INTRAMUSCULAR
  Filled 2014-12-15: qty 0.5

## 2014-12-15 MED ORDER — MIDAZOLAM HCL 2 MG/2ML IJ SOLN
INTRAMUSCULAR | Status: AC
  Filled 2014-12-15: qty 2

## 2014-12-15 MED ORDER — FENTANYL CITRATE (PF) 100 MCG/2ML IJ SOLN
INTRAMUSCULAR | Status: DC | PRN
  Administered 2014-12-15: 25 ug via INTRAVENOUS
  Administered 2014-12-15 (×2): 50 ug via INTRAVENOUS
  Administered 2014-12-15: 100 ug via INTRAVENOUS
  Administered 2014-12-15: 50 ug via INTRAVENOUS
  Administered 2014-12-15: 100 ug via INTRAVENOUS
  Administered 2014-12-15: 50 ug via INTRAVENOUS

## 2014-12-15 MED ORDER — PHENOL 1.4 % MT LIQD: 1.0000 | OROMUCOSAL | Status: DC | PRN

## 2014-12-15 MED ORDER — TRAZODONE HCL 50 MG PO TABS
50.0000 mg | ORAL_TABLET | Freq: Every day | ORAL | Status: DC
  Administered 2014-12-15: 50 mg via ORAL
  Filled 2014-12-15 (×2): qty 1

## 2014-12-15 MED ORDER — ALBUTEROL SULFATE HFA 108 (90 BASE) MCG/ACT IN AERS
INHALATION_SPRAY | RESPIRATORY_TRACT | Status: DC | PRN
  Administered 2014-12-15: 6 via RESPIRATORY_TRACT

## 2014-12-15 MED ORDER — ROCURONIUM BROMIDE 100 MG/10ML IV SOLN
INTRAVENOUS | Status: DC | PRN
  Administered 2014-12-15 (×2): 10 mg via INTRAVENOUS
  Administered 2014-12-15: 50 mg via INTRAVENOUS

## 2014-12-15 MED ORDER — BUPIVACAINE-EPINEPHRINE (PF) 0.25% -1:200000 IJ SOLN
INTRAMUSCULAR | Status: AC
  Filled 2014-12-15: qty 30

## 2014-12-15 MED ORDER — ATORVASTATIN CALCIUM 20 MG PO TABS
20.0000 mg | ORAL_TABLET | Freq: Every day | ORAL | Status: DC
  Administered 2014-12-15: 20 mg via ORAL
  Filled 2014-12-15 (×2): qty 1

## 2014-12-15 MED ORDER — MIDAZOLAM HCL 5 MG/5ML IJ SOLN
INTRAMUSCULAR | Status: DC | PRN
  Administered 2014-12-15: 2 mg via INTRAVENOUS

## 2014-12-15 MED ORDER — 0.9 % SODIUM CHLORIDE (POUR BTL) OPTIME
TOPICAL | Status: DC | PRN
  Administered 2014-12-15: 1000 mL

## 2014-12-15 MED ORDER — PHENYLEPHRINE HCL 10 MG/ML IJ SOLN
INTRAMUSCULAR | Status: AC
  Filled 2014-12-15: qty 1

## 2014-12-15 MED ORDER — DIAZEPAM 5 MG PO TABS
ORAL_TABLET | ORAL | Status: AC
  Administered 2014-12-15: 5 mg via ORAL
  Filled 2014-12-15: qty 1

## 2014-12-15 MED ORDER — PHENYLEPHRINE HCL 10 MG/ML IJ SOLN
INTRAMUSCULAR | Status: DC | PRN
  Administered 2014-12-15 (×3): 80 ug via INTRAVENOUS
  Administered 2014-12-15: 120 ug via INTRAVENOUS
  Administered 2014-12-15: 80 ug via INTRAVENOUS

## 2014-12-15 MED ORDER — LIDOCAINE HCL (CARDIAC) 20 MG/ML IV SOLN
INTRAVENOUS | Status: AC
  Filled 2014-12-15: qty 5

## 2014-12-15 MED ORDER — MENTHOL 3 MG MT LOZG: 1.0000 | LOZENGE | OROMUCOSAL | Status: DC | PRN

## 2014-12-15 MED ORDER — PROPOFOL 10 MG/ML IV BOLUS
INTRAVENOUS | Status: AC
  Filled 2014-12-15: qty 20

## 2014-12-15 MED ORDER — OXYCODONE HCL 5 MG PO TABS
5.0000 mg | ORAL_TABLET | Freq: Once | ORAL | Status: AC | PRN
  Administered 2014-12-15: 5 mg via ORAL

## 2014-12-15 MED ORDER — VARENICLINE TARTRATE 1 MG PO TABS
1.0000 mg | ORAL_TABLET | Freq: Two times a day (BID) | ORAL | Status: DC
  Filled 2014-12-15 (×3): qty 1

## 2014-12-15 MED ORDER — ONDANSETRON HCL 4 MG/2ML IJ SOLN
INTRAMUSCULAR | Status: AC
  Filled 2014-12-15: qty 2

## 2014-12-15 MED ORDER — NEOSTIGMINE METHYLSULFATE 10 MG/10ML IV SOLN
INTRAVENOUS | Status: DC | PRN
  Administered 2014-12-15: 4 mg via INTRAVENOUS

## 2014-12-15 MED ORDER — SENNOSIDES-DOCUSATE SODIUM 8.6-50 MG PO TABS
1.0000 | ORAL_TABLET | Freq: Every evening | ORAL | Status: DC | PRN
  Filled 2014-12-15: qty 1

## 2014-12-15 MED ORDER — THROMBIN 20000 UNITS EX SOLR
CUTANEOUS | Status: AC
  Filled 2014-12-15: qty 20000

## 2014-12-15 MED ORDER — CEFAZOLIN SODIUM 1-5 GM-% IV SOLN
1.0000 g | Freq: Three times a day (TID) | INTRAVENOUS | Status: AC
  Administered 2014-12-15 – 2014-12-16 (×2): 1 g via INTRAVENOUS
  Filled 2014-12-15 (×2): qty 50

## 2014-12-15 MED ORDER — THROMBIN 20000 UNITS EX KIT
PACK | CUTANEOUS | Status: DC | PRN
  Administered 2014-12-15: 20000 [IU] via TOPICAL

## 2014-12-15 MED ORDER — HYDROMORPHONE HCL 1 MG/ML IJ SOLN
INTRAMUSCULAR | Status: AC
  Administered 2014-12-15: 0.25 mg via INTRAVENOUS
  Filled 2014-12-15: qty 1

## 2014-12-15 MED ORDER — OXYCODONE HCL 5 MG/5ML PO SOLN: 5.0000 mg | Freq: Once | ORAL | Status: AC | PRN

## 2014-12-15 MED ORDER — OXYCODONE HCL 5 MG PO TABS
ORAL_TABLET | ORAL | Status: AC
  Filled 2014-12-15: qty 1

## 2014-12-15 MED ORDER — TERBINAFINE HCL 250 MG PO TABS
250.0000 mg | ORAL_TABLET | Freq: Every day | ORAL | Status: DC
  Administered 2014-12-15: 250 mg via ORAL
  Filled 2014-12-15 (×2): qty 1

## 2014-12-15 MED ORDER — LIDOCAINE HCL (CARDIAC) 20 MG/ML IV SOLN
INTRAVENOUS | Status: DC | PRN
  Administered 2014-12-15: 100 mg via INTRAVENOUS

## 2014-12-15 MED ORDER — DEXTROSE 5 % IV SOLN
10.0000 mg | INTRAVENOUS | Status: DC | PRN
  Administered 2014-12-15: 40 ug/min via INTRAVENOUS

## 2014-12-15 MED ORDER — DEXMEDETOMIDINE HCL 200 MCG/2ML IV SOLN
INTRAVENOUS | Status: DC | PRN
  Administered 2014-12-15: 8 ug via INTRAVENOUS
  Administered 2014-12-15 (×4): 12 ug via INTRAVENOUS
  Administered 2014-12-15: 8 ug via INTRAVENOUS
  Administered 2014-12-15: 12 ug via INTRAVENOUS

## 2014-12-15 MED ORDER — ONDANSETRON HCL 4 MG/2ML IJ SOLN: 4.0000 mg | INTRAMUSCULAR | Status: DC | PRN

## 2014-12-15 MED ORDER — FLEET ENEMA 7-19 GM/118ML RE ENEM
1.0000 | ENEMA | Freq: Once | RECTAL | Status: AC | PRN
  Filled 2014-12-15: qty 1

## 2014-12-15 MED ORDER — ACETAMINOPHEN 325 MG PO TABS
650.0000 mg | ORAL_TABLET | ORAL | Status: DC | PRN
Start: 2014-12-15 — End: 2014-12-16

## 2014-12-15 MED ORDER — METFORMIN HCL 500 MG PO TABS
750.0000 mg | ORAL_TABLET | Freq: Three times a day (TID) | ORAL | Status: DC
  Filled 2014-12-15 (×4): qty 2

## 2014-12-15 MED ORDER — SODIUM CHLORIDE 0.9 % IJ SOLN: 3.0000 mL | INTRAMUSCULAR | Status: DC | PRN

## 2014-12-15 MED ORDER — SODIUM CHLORIDE 0.9 % IV SOLN: 250.0000 mL | INTRAVENOUS | Status: DC

## 2014-12-15 MED ORDER — SODIUM CHLORIDE 0.9 % IJ SOLN: 3.0000 mL | Freq: Two times a day (BID) | INTRAMUSCULAR | Status: DC

## 2014-12-15 MED ORDER — GLYCOPYRROLATE 0.2 MG/ML IJ SOLN
INTRAMUSCULAR | Status: DC | PRN
  Administered 2014-12-15: 0.6 mg via INTRAVENOUS

## 2014-12-15 MED ORDER — LISINOPRIL 10 MG PO TABS
10.0000 mg | ORAL_TABLET | Freq: Every day | ORAL | Status: DC
  Filled 2014-12-15: qty 1

## 2014-12-15 MED ORDER — MORPHINE SULFATE 2 MG/ML IJ SOLN: 1.0000 mg | INTRAMUSCULAR | Status: DC | PRN

## 2014-12-15 MED ORDER — BUPIVACAINE-EPINEPHRINE 0.25% -1:200000 IJ SOLN
INTRAMUSCULAR | Status: DC | PRN
  Administered 2014-12-15: 4 mL

## 2014-12-15 MED ORDER — BISACODYL 5 MG PO TBEC
5.0000 mg | DELAYED_RELEASE_TABLET | Freq: Every day | ORAL | Status: DC | PRN
  Filled 2014-12-15: qty 1

## 2014-12-15 MED ORDER — PROPOFOL 10 MG/ML IV BOLUS
INTRAVENOUS | Status: DC | PRN
  Administered 2014-12-15: 200 mg via INTRAVENOUS
  Administered 2014-12-15: 80 mg via INTRAVENOUS

## 2014-12-15 SURGICAL SUPPLY — 78 items
APL SKNCLS STERI-STRIP NONHPOA (GAUZE/BANDAGES/DRESSINGS) ×1
BENZOIN TINCTURE PRP APPL 2/3 (GAUZE/BANDAGES/DRESSINGS) ×3 IMPLANT
BIT DRILL NEURO 2X3.1 SFT TUCH (MISCELLANEOUS) ×1 IMPLANT
BIT DRILL SRG 14X2.2XFLT CHK (BIT) IMPLANT
BIT DRL SRG 14X2.2XFLT CHK (BIT) ×1
BLADE SURG 15 STRL LF DISP TIS (BLADE) ×1 IMPLANT
BLADE SURG 15 STRL SS (BLADE)
BLADE SURG ROTATE 9660 (MISCELLANEOUS) ×3 IMPLANT
BUR MATCHSTICK NEURO 3.0 LAGG (BURR) IMPLANT
CANISTER SUCTION WELLS/JOHNSON (MISCELLANEOUS) ×2 IMPLANT
CARTRIDGE OIL MAESTRO DRILL (MISCELLANEOUS) ×1 IMPLANT
CLOSURE WOUND 1/2 X4 (GAUZE/BANDAGES/DRESSINGS) ×1
COLLAR CERV LO CONTOUR FIRM DE (SOFTGOODS) IMPLANT
CORDS BIPOLAR (ELECTRODE) ×3 IMPLANT
COVER SURGICAL LIGHT HANDLE (MISCELLANEOUS) ×3 IMPLANT
CRADLE DONUT ADULT HEAD (MISCELLANEOUS) ×3 IMPLANT
DEVICE ENDSKLTN TCERV VBR SM 6 (Orthopedic Implant) IMPLANT
DIFFUSER DRILL AIR PNEUMATIC (MISCELLANEOUS) ×3 IMPLANT
DRAIN JACKSON RD 7FR 3/32 (WOUND CARE) IMPLANT
DRAPE C-ARM 42X72 X-RAY (DRAPES) ×3 IMPLANT
DRAPE POUCH INSTRU U-SHP 10X18 (DRAPES) ×3 IMPLANT
DRAPE SURG 17X23 STRL (DRAPES) ×11 IMPLANT
DRILL BIT SKYLINE 14MM (BIT) ×3
DRILL NEURO 2X3.1 SOFT TOUCH (MISCELLANEOUS) ×3
DURAPREP 26ML APPLICATOR (WOUND CARE) ×3 IMPLANT
ELECT COATED BLADE 2.86 ST (ELECTRODE) ×3 IMPLANT
ELECT REM PT RETURN 9FT ADLT (ELECTROSURGICAL) ×3
ELECTRODE REM PT RTRN 9FT ADLT (ELECTROSURGICAL) ×1 IMPLANT
ENDOSKELETON T CERV VBR SM 6MM (Orthopedic Implant) ×3 IMPLANT
EVACUATOR SILICONE 100CC (DRAIN) IMPLANT
GAUZE SPONGE 4X4 12PLY STRL (GAUZE/BANDAGES/DRESSINGS) ×3 IMPLANT
GAUZE SPONGE 4X4 16PLY XRAY LF (GAUZE/BANDAGES/DRESSINGS) ×3 IMPLANT
GLOVE BIO SURGEON STRL SZ 6.5 (GLOVE) ×1 IMPLANT
GLOVE BIO SURGEON STRL SZ7 (GLOVE) ×5 IMPLANT
GLOVE BIO SURGEON STRL SZ8 (GLOVE) ×3 IMPLANT
GLOVE BIO SURGEONS STRL SZ 6.5 (GLOVE) ×1
GLOVE BIOGEL PI IND STRL 7.5 (GLOVE) ×2 IMPLANT
GLOVE BIOGEL PI IND STRL 8 (GLOVE) ×1 IMPLANT
GLOVE BIOGEL PI INDICATOR 7.5 (GLOVE) ×4
GLOVE BIOGEL PI INDICATOR 8 (GLOVE) ×2
GOWN STRL REUS W/ TWL LRG LVL3 (GOWN DISPOSABLE) ×1 IMPLANT
GOWN STRL REUS W/ TWL XL LVL3 (GOWN DISPOSABLE) ×1 IMPLANT
GOWN STRL REUS W/TWL LRG LVL3 (GOWN DISPOSABLE) ×15
GOWN STRL REUS W/TWL XL LVL3 (GOWN DISPOSABLE) ×3
INTERLOCK LRDTC CRVCL VBR 6MM (Bone Implant) IMPLANT
IV CATH 14GX2 1/4 (CATHETERS) ×3 IMPLANT
KIT BASIN OR (CUSTOM PROCEDURE TRAY) ×3 IMPLANT
KIT ROOM TURNOVER OR (KITS) ×3 IMPLANT
LORDOTIC CERVICAL VBR 6MM SM (Bone Implant) ×3 IMPLANT
MANIFOLD NEPTUNE II (INSTRUMENTS) ×1 IMPLANT
NDL SPNL 20GX3.5 QUINCKE YW (NEEDLE) ×1 IMPLANT
NEEDLE 27GAX1X1/2 (NEEDLE) ×3 IMPLANT
NEEDLE SPNL 20GX3.5 QUINCKE YW (NEEDLE) ×3 IMPLANT
NS IRRIG 1000ML POUR BTL (IV SOLUTION) ×3 IMPLANT
OIL CARTRIDGE MAESTRO DRILL (MISCELLANEOUS) ×3
PACK ORTHO CERVICAL (CUSTOM PROCEDURE TRAY) ×3 IMPLANT
PAD ARMBOARD 7.5X6 YLW CONV (MISCELLANEOUS) ×6 IMPLANT
PATTIES SURGICAL .5 X.5 (GAUZE/BANDAGES/DRESSINGS) IMPLANT
PATTIES SURGICAL .5 X1 (DISPOSABLE) ×2 IMPLANT
PIN DISTRACTION 14 (PIN) ×4 IMPLANT
PLATE TWO LEVEL SKYLINE 30MM (Plate) ×2 IMPLANT
PUTTY BONE DBX 2.5 MIS (Bone Implant) ×2 IMPLANT
SCREW VAR SELF TAP SKYLINE 14M (Screw) ×12 IMPLANT
SPONGE INTESTINAL PEANUT (DISPOSABLE) ×3 IMPLANT
SPONGE SURGIFOAM ABS GEL 100 (HEMOSTASIS) ×3 IMPLANT
STRIP CLOSURE SKIN 1/2X4 (GAUZE/BANDAGES/DRESSINGS) ×2 IMPLANT
SURGIFLO TRUKIT (HEMOSTASIS) IMPLANT
SUT MNCRL AB 4-0 PS2 18 (SUTURE) ×3 IMPLANT
SUT SILK 4 0 (SUTURE)
SUT SILK 4-0 18XBRD TIE 12 (SUTURE) IMPLANT
SUT VIC AB 2-0 CT2 18 VCP726D (SUTURE) ×3 IMPLANT
SYR BULB IRRIGATION 50ML (SYRINGE) ×3 IMPLANT
SYR CONTROL 10ML LL (SYRINGE) ×6 IMPLANT
TAPE CLOTH 4X10 WHT NS (GAUZE/BANDAGES/DRESSINGS) ×3 IMPLANT
TAPE UMBILICAL COTTON 1/8X30 (MISCELLANEOUS) ×3 IMPLANT
TOWEL OR 17X24 6PK STRL BLUE (TOWEL DISPOSABLE) ×3 IMPLANT
TOWEL OR 17X26 10 PK STRL BLUE (TOWEL DISPOSABLE) ×3 IMPLANT
YANKAUER SUCT BULB TIP NO VENT (SUCTIONS) ×3 IMPLANT

## 2014-12-15 NOTE — Anesthesia Preprocedure Evaluation (Signed)
Anesthesia Evaluation  Patient identified by MRN, date of birth, ID band Patient awake    Reviewed: Allergy & Precautions, NPO status , Patient's Chart, lab work & pertinent test results  Airway Mallampati: II  TM Distance: >3 FB     Dental  (+) Poor Dentition, Loose,    Pulmonary asthma , Current Smoker,  breath sounds clear to auscultation        Cardiovascular hypertension, Rhythm:Regular Rate:Normal     Neuro/Psych    GI/Hepatic negative GI ROS, Neg liver ROS,   Endo/Other  diabetes, Well Controlled  Renal/GU      Musculoskeletal  (+) Arthritis -,   Abdominal (+) + obese,   Peds  Hematology   Anesthesia Other Findings   Reproductive/Obstetrics                             Anesthesia Physical Anesthesia Plan  ASA: III  Anesthesia Plan: General   Post-op Pain Management:    Induction: Intravenous  Airway Management Planned: Oral ETT  Additional Equipment:   Intra-op Plan:   Post-operative Plan: Extubation in OR  Informed Consent: I have reviewed the patients History and Physical, chart, labs and discussed the procedure including the risks, benefits and alternatives for the proposed anesthesia with the patient or authorized representative who has indicated his/her understanding and acceptance.   Dental advisory given  Plan Discussed with: CRNA and Surgeon  Anesthesia Plan Comments:         Anesthesia Quick Evaluation

## 2014-12-15 NOTE — Transfer of Care (Signed)
Immediate Anesthesia Transfer of Care Note  Patient: Edward Santiago  Procedure(s) Performed: Procedure(s) with comments: ANTERIOR CERVICAL DECOMPRESSION/DISCECTOMY FUSION 2 LEVELS (N/A) - Anterior cervical decompression fusion, cervical 5-6, cervical 6-7 with instrumentation and allograft  Patient Location: PACU  Anesthesia Type:General  Level of Consciousness: awake, alert  and oriented  Airway & Oxygen Therapy: Patient Spontanous Breathing and Patient connected to nasal cannula oxygen  Post-op Assessment: Report given to RN, Post -op Vital signs reviewed and stable and Patient moving all extremities X 4  Post vital signs: Reviewed and stable  Last Vitals:  Filed Vitals:   12/15/14 1710  BP: 129/69  Pulse: 97  Temp: 37.1 C  Resp: 5    Complications: No apparent anesthesia complications

## 2014-12-15 NOTE — Anesthesia Procedure Notes (Signed)
Procedure Name: Intubation Performed by: Gean Maidens Pre-anesthesia Checklist: Patient identified, Emergency Drugs available, Suction available, Patient being monitored and Timeout performed Patient Re-evaluated:Patient Re-evaluated prior to inductionOxygen Delivery Method: Circle system utilized Preoxygenation: Pre-oxygenation with 100% oxygen Intubation Type: IV induction Ventilation: Mask ventilation without difficulty and Oral airway inserted - appropriate to patient size Laryngoscope Size: Mac and 4 Grade View: Grade II Tube type: Oral Number of attempts: 1 Placement Confirmation: ETT inserted through vocal cords under direct vision,  breath sounds checked- equal and bilateral,  positive ETCO2 and CO2 detector Secured at: 23 cm Tube secured with: Tape Dental Injury: Teeth and Oropharynx as per pre-operative assessment

## 2014-12-15 NOTE — Progress Notes (Signed)
Orthopedic Tech Progress Note Patient Details:  Edward Santiago May 29, 1961 [id number]  Ortho Devices Type of Ortho Device: [place] cervical collar Ortho Device/Splint Location: at bedside  Ortho Device/Splint Interventions: Ordered, Application   Mehdi, Gironda 12/15/2014, 9:34 PM

## 2014-12-16 DIAGNOSIS — M5412 Radiculopathy, cervical region: Secondary | ICD-10-CM | POA: Diagnosis not present

## 2014-12-16 LAB — GLUCOSE, CAPILLARY: GLUCOSE-CAPILLARY: 122 mg/dL — AB (ref 70–99)

## 2014-12-16 MED FILL — Thrombin For Soln 20000 Unit: CUTANEOUS | Qty: 1 | Status: AC

## 2014-12-16 NOTE — Progress Notes (Signed)
UR completed 

## 2014-12-16 NOTE — Op Note (Signed)
NAME:  Edward Santiago, Edward Santiago NO.:  [id number]  MEDICAL RECORD NO.:  [id number]  LOCATION:  [id number]                        FACILITY:  [facility]  PHYSICIAN:  Phylliss Bob, MD      DATE OF BIRTH:  28-Oct-1960  DATE OF PROCEDURE:  12/15/2014                              OPERATIVE REPORT   PREOPERATIVE DIAGNOSES: 1. Right-sided cervical radiculopathy. 2. Right-sided neural foraminal stenosis at C5-6, C6-7.  POSTOPERATIVE DIAGNOSES: 1. Right-sided cervical radiculopathy. 2. Right-sided neural foraminal stenosis at C5-6, C6-7.  PROCEDURE: 1. Anterior cervical decompression and fusion C5-6, C6-7. 2. Placement of anterior instrumentation, C5-C7. 3. Insertion of interbody device x2 (Titan interbody spacers). 4. Use of morselized allograft. 5. Intraoperative use of fluoroscopy.  SURGEON:  Phylliss Bob, MD  ASSISTANT:  Pricilla Holm, PA-C  ANESTHESIA:  General endotracheal anesthesia.  COMPLICATIONS:  None.  DISPOSITION:  Stable.  ESTIMATED BLOOD LOSS:  Minimal.  INDICATIONS FOR SURGERY:  Briefly, Edward Santiago is a very pleasant 54- year-old male who did initially presented to me on April 09, 2014, with pain in his bilateral arms.  The left arm pain did improve over time, however, he did go on to have substantial pain in the right arm.  An MRI did reveal stenosis at C5-6 and C6-7, and an EMG did reveal chronic right C6 radiculopathy.  He failed multiple forms of conservative care, and we did discuss proceeding with the procedure noted above.  The patient did fully understand the risks and limitations of the procedure.  OPERATIVE DETAILS:  On December 15, 2014, the patient was brought to surgery and general endotracheal anesthesia was administered.  The patient was placed supine on a hospital bed.  All bony prominences were padded.  The neck was prepped and draped and a time-out was performed. A left-sided transverse incision was made.  A Smith-Robinson approach was used  and the anterior spine was noted.  A self-retaining retractor was placed.  I did remove anterior osteophytes from C5-6 and C6-7. Caspar pins were placed into the C6 and C7 vertebral bodies and distraction was applied.  I then proceeded with a standard diskectomy at the C6-7 intervertebral space.  The posterior longitudinal ligament was identified and entered using a nerve hook.  I then used a #1 followed by #2 Kerrison to perform a thorough and complete bilateral neural foraminal decompression.  The endplates were then prepared, and the appropriate size interbody spacer was packed with DBX mix and tamped into position in the usual fashion.  The lower Caspar pin was removed. I then turned my attention to the C5-6 intervertebral space.  Once again, distraction was applied across the interspace using Caspar pins. A diskectomy was performed, and a bilateral neural foraminal decompression was performed and confirmed using a nerve hook.  After appropriately preparing the endplates, the appropriate size interbody spacer was packed with DBX mix and tamped into position in the usual fashion.  I was very pleased with the press fit of each of the implants. The Caspar pins were then removed and bone wax was placed in their place.  The appropriate-sized anterior cervical plate was placed over the anterior spine.  A 14-mm variable angle screws were placed, 2 in each  vertebral body from C5-C7 for a total of 6 vertebral body screws. I was very pleased with the press fit of each of the screws.  The screws were then locked to the plate using the CAM locking mechanism.  The wound was then copiously irrigated.  I then explored the wound for any undue bleeding, and there was no substantial bleeding encountered.  I was very pleased with the final AP and lateral fluoroscopic images.  I then proceeded with closure.  The platysma was closed using 2-0 Vicryl, and the skin was closed using 3-0 Monocryl.  Benzoin and  Steri-Strips were applied followed by sterile dressing.  All instrument counts were correct at the termination of the procedure.  Of note, Pricilla Holm was my assistant throughout surgery, and did aid in retraction, suctioning, and closure.     Phylliss Bob, MD     MD/MEDQ  D:  12/15/2014  T:  12/16/2014  Job:  [id number]

## 2014-12-16 NOTE — Progress Notes (Signed)
    Patient doing well Right arm pain resolved   Physical Exam: Filed Vitals:   12/16/14 0410  BP: 134/76  Pulse: 79  Temp: 98.5 F (36.9 C)  Resp: 20    Dressing in place NVI Neck soft/supple  POD #1 s/p C5-7 ACDF, doing well  - encourage ambulation - Percocet for pain, Valium for muscle spasms - d/c home later today, f/u 2 weeks

## 2014-12-16 NOTE — Progress Notes (Signed)
Patient alert and oriented, mae's well, voiding adequate amount of urine, swallowing without difficulty, no c/o pain. Patient discharged home with family. Script and discharged instructions given to patient. Patient and family stated understanding of d/c instructions given and has an appointment with MD. 

## 2014-12-17 ENCOUNTER — Encounter (HOSPITAL_COMMUNITY): Payer: Self-pay | Admitting: Orthopedic Surgery

## 2014-12-21 NOTE — Anesthesia Postprocedure Evaluation (Signed)
  Anesthesia Post-op Note  Patient: Edward Santiago  Procedure(s) Performed: Procedure(s) with comments: ANTERIOR CERVICAL DECOMPRESSION/DISCECTOMY FUSION 2 LEVELS (N/A) - Anterior cervical decompression fusion, cervical 5-6, cervical 6-7 with instrumentation and allograft  Patient Location: PACU  Anesthesia Type:General  Level of Consciousness: awake and alert   Airway and Oxygen Therapy: Patient Spontanous Breathing  Post-op Pain: mild  Post-op Assessment: Post-op Vital signs reviewed  Post-op Vital Signs: stable  Last Vitals:  Filed Vitals:   12/16/14 0822  BP: 118/66  Pulse: 70  Temp: 37 C  Resp: 18    Complications: No apparent anesthesia complications

## 2014-12-22 NOTE — Discharge Summary (Signed)
Patient ID: CROCKETT RALLO MRN: [id number] DOB/AGE: April 16, 1961 54 y.o.  Admit date: 12/15/2014 Discharge date: 12/16/2014  Admission Diagnoses:  Active Problems:   Radiculopathy   Discharge Diagnoses:  Same  Past Medical History  Diagnosis Date  . Sleep apnea   . Arthritis   . Hyperlipidemia     takes Atorvastatin daily  . Insomnia     takes Trazodone at bedtime  . Diabetes mellitus without complication     takes Metformin and Amaryl daily  . Asthma     a "touch"  . Weakness     and numbness right hand  . Neck pain   . Urinary frequency   . History of kidney stones   . Panic attacks     but doesn't take any meds    Surgeries: Procedure(s): ANTERIOR CERVICAL DECOMPRESSION/DISCECTOMY FUSION 2 LEVELS C5-7 on 12/15/2014   Consultants:  None  Discharged Condition: Improved  Hospital Course: Edward Santiago is an 54 y.o. male who was admitted 12/15/2014 for operative treatment of radiculopathy. Patient has severe unremitting pain that affects sleep, daily activities, and work/hobbies. After pre-op clearance the patient was taken to the operating room on 12/15/2014 and underwent  Procedure(s): ANTERIOR CERVICAL DECOMPRESSION/DISCECTOMY FUSION 2 LEVELS C5-7.    Patient was given perioperative antibiotics:  Anti-infectives    Start     Dose/Rate Route Frequency Ordered Stop   12/15/14 2200  terbinafine (LAMISIL) tablet 250 mg  Status:  Discontinued     250 mg Oral Daily at bedtime 12/15/14 1935 12/16/14 1459   12/15/14 2100  ceFAZolin (ANCEF) IVPB 1 g/50 mL premix     1 g 100 mL/hr over 30 Minutes Intravenous Every 8 hours 12/15/14 1935 12/16/14 0530   12/15/14 0600  ceFAZolin (ANCEF) IVPB 2 g/50 mL premix     2 g 100 mL/hr over 30 Minutes Intravenous On call to O.R. 12/14/14 1325 12/15/14 1405       Patient was given sequential compression devices, early ambulation to prevent DVT.  Patient benefited maximally from hospital stay and there were no  complications.    Recent vital signs: BP 118/66 mmHg  Pulse 70  Temp(Src) 98.6 F (37 C) (Oral)  Resp 18  Ht 6' (1.829 m)  Wt 117.113 kg (258 lb 3 oz)  BMI 35.01 kg/m2  SpO2 99%  Discharge Medications:     Medication List    STOP taking these medications        cephALEXin 500 MG capsule  Commonly known as:  KEFLEX      TAKE these medications        atorvastatin 20 MG tablet  Commonly known as:  LIPITOR  Take 20 mg by mouth daily.     fluconazole 200 MG tablet  Commonly known as:  DIFLUCAN  Take 1 tablet (200 mg total) by mouth daily.     glimepiride 1 MG tablet  Commonly known as:  AMARYL  Take 1 mg by mouth daily with breakfast.     lisinopril 10 MG tablet  Commonly known as:  PRINIVIL,ZESTRIL  Take 10 mg by mouth daily.     metFORMIN 500 MG tablet  Commonly known as:  GLUCOPHAGE  Take 750 mg by mouth 3 (three) times daily.     multivitamin with minerals tablet  Take 1 tablet by mouth daily.     terbinafine 250 MG tablet  Commonly known as:  LAMISIL  Take 250 mg by mouth daily.     TRAZODONE  HCL PO  Take 1 tablet by mouth at bedtime.     varenicline 1 MG tablet  Commonly known as:  CHANTIX  Take 1 mg by mouth 2 (two) times daily.        Diagnostic Studies: Dg Chest 2 View  12/08/2014   CLINICAL DATA:  Pre operative respiratory exam. Cervical disc disease.  EXAM: CHEST  2 VIEW  COMPARISON:  11/28/2012  FINDINGS: Heart size and pulmonary vascularity are normal and the lungs are clear. There are multiple old right anterior lateral rib fractures. No acute osseous abnormality. No effusions.  IMPRESSION: No acute abnormalities.   Electronically Signed   By: Lorriane Shire M.D.   On: 12/08/2014 17:01   Dg Cervical Spine 2-3 Views  12/15/2014   CLINICAL DATA:  54 year old male with a history of spine surgery.  ACDF, C5-C6 and C6-C7.  EXAM: CERVICAL SPINE - 2-3 VIEW  COMPARISON:  MRI 09/08/2014  FINDINGS: Intraoperative spot images demonstrate surgical changes  of anterior cervical discectomy and fusion with interbody fixation of C5-C6 and C6-C7. No complicating features identified.  Endotracheal tube in position.  IMPRESSION: Intraoperative spot images demonstrate surgical changes of anterior cervical discectomy and fusion of C5-C6 and O0-H2 with no complicating features.  Please refer to the dictated operative report for full details of intraoperative findings and procedure.  Signed,  Dulcy Fanny. Earleen Newport, DO  [facility]  [facility]   Electronically Signed   By: Corrie Mckusick D.O.   On: 12/15/2014 16:51   Dg C-arm 1-60 Min  12/15/2014   CLINICAL DATA:  Anterior fusion  EXAM: DG C-ARM 61-120 MIN  FLUOROSCOPY TIME:  Radiation Exposure Index (as provided by the fluoroscopic device):  If the device does not provide the exposure index:  Fluoroscopy Time (in minutes and seconds):  14.3 seconds  Number of Acquired Images:  COMPARISON:  MR C-spine of 09/08/2014  FINDINGS: C-arm fluoroscopy was provided for anterior fusion at C5-6 and C6-7.  IMPRESSION: C-arm fluoroscopy provided.   Electronically Signed   By: Ivar Drape M.D.   On: 12/15/2014 16:52    Disposition: 01-Home or Self Care   POD #1 s/p C5-7 ACDF, doing well  - encourage ambulation - Percocet for pain, Valium for muscle spasms -Written scripts for pain signed and in chart -D/C instructions sheet printed and in chart -D/C today  -F/U in office 2 weeks   Signed: Justice Britain 12/22/2014, 7:29 PM

## 2015-03-14 ENCOUNTER — Encounter (HOSPITAL_COMMUNITY): Payer: Self-pay | Admitting: Emergency Medicine

## 2015-03-14 ENCOUNTER — Inpatient Hospital Stay (HOSPITAL_COMMUNITY)
Admission: EM | Admit: 2015-03-14 | Discharge: 2015-03-15 | DRG: 918 | Disposition: A | Discharge status: Home / Self Care | Payer: No Typology Code available for payment source | Attending: Internal Medicine | Admitting: Internal Medicine

## 2015-03-14 DIAGNOSIS — F410 Panic disorder [episodic paroxysmal anxiety] without agoraphobia: Secondary | ICD-10-CM | POA: Diagnosis present

## 2015-03-14 DIAGNOSIS — T43291A Poisoning by other antidepressants, accidental (unintentional), initial encounter: Principal | ICD-10-CM | POA: Diagnosis present

## 2015-03-14 DIAGNOSIS — T50901A Poisoning by unspecified drugs, medicaments and biological substances, accidental (unintentional), initial encounter: Secondary | ICD-10-CM | POA: Diagnosis present

## 2015-03-14 DIAGNOSIS — E119 Type 2 diabetes mellitus without complications: Secondary | ICD-10-CM

## 2015-03-14 DIAGNOSIS — M25512 Pain in left shoulder: Secondary | ICD-10-CM | POA: Diagnosis present

## 2015-03-14 DIAGNOSIS — Z981 Arthrodesis status: Secondary | ICD-10-CM | POA: Diagnosis not present

## 2015-03-14 DIAGNOSIS — Z820 Family history of epilepsy and other diseases of the nervous system: Secondary | ICD-10-CM | POA: Diagnosis not present

## 2015-03-14 DIAGNOSIS — T50901D Poisoning by unspecified drugs, medicaments and biological substances, accidental (unintentional), subsequent encounter: Secondary | ICD-10-CM | POA: Diagnosis not present

## 2015-03-14 DIAGNOSIS — Z833 Family history of diabetes mellitus: Secondary | ICD-10-CM | POA: Diagnosis not present

## 2015-03-14 DIAGNOSIS — G4730 Sleep apnea, unspecified: Secondary | ICD-10-CM | POA: Insufficient documentation

## 2015-03-14 DIAGNOSIS — E785 Hyperlipidemia, unspecified: Secondary | ICD-10-CM | POA: Diagnosis present

## 2015-03-14 DIAGNOSIS — J45909 Unspecified asthma, uncomplicated: Secondary | ICD-10-CM | POA: Diagnosis present

## 2015-03-14 DIAGNOSIS — Z87891 Personal history of nicotine dependence: Secondary | ICD-10-CM | POA: Diagnosis not present

## 2015-03-14 DIAGNOSIS — Z87442 Personal history of urinary calculi: Secondary | ICD-10-CM

## 2015-03-14 DIAGNOSIS — I10 Essential (primary) hypertension: Secondary | ICD-10-CM | POA: Diagnosis present

## 2015-03-14 DIAGNOSIS — G4733 Obstructive sleep apnea (adult) (pediatric): Secondary | ICD-10-CM | POA: Diagnosis present

## 2015-03-14 DIAGNOSIS — Z79899 Other long term (current) drug therapy: Secondary | ICD-10-CM

## 2015-03-14 DIAGNOSIS — G4700 Insomnia, unspecified: Secondary | ICD-10-CM | POA: Diagnosis present

## 2015-03-14 DIAGNOSIS — Z85828 Personal history of other malignant neoplasm of skin: Secondary | ICD-10-CM

## 2015-03-14 DIAGNOSIS — M1990 Unspecified osteoarthritis, unspecified site: Secondary | ICD-10-CM | POA: Diagnosis present

## 2015-03-14 HISTORY — DX: Basal cell carcinoma of skin, unspecified: C44.91

## 2015-03-14 HISTORY — DX: Essential (primary) hypertension: I10

## 2015-03-14 LAB — CBC WITH DIFFERENTIAL/PLATELET
BASOS PCT: 0 % (ref 0–1)
Basophils Absolute: 0 10*3/uL (ref 0.0–0.1)
EOS ABS: 0.2 10*3/uL (ref 0.0–0.7)
Eosinophils Relative: 2 % (ref 0–5)
HEMATOCRIT: 41.5 % (ref 39.0–52.0)
Hemoglobin: 13.3 g/dL (ref 13.0–17.0)
LYMPHS ABS: 2.5 10*3/uL (ref 0.7–4.0)
LYMPHS PCT: 33 % (ref 12–46)
MCH: 28.7 pg (ref 26.0–34.0)
MCHC: 32 g/dL (ref 30.0–36.0)
MCV: 89.6 fL (ref 78.0–100.0)
MONO ABS: 0.8 10*3/uL (ref 0.1–1.0)
Monocytes Relative: 11 % (ref 3–12)
Neutro Abs: 4.1 10*3/uL (ref 1.7–7.7)
Neutrophils Relative %: 54 % (ref 43–77)
Platelets: 267 10*3/uL (ref 150–400)
RBC: 4.63 MIL/uL (ref 4.22–5.81)
RDW: 13.3 % (ref 11.5–15.5)
WBC: 7.6 10*3/uL (ref 4.0–10.5)

## 2015-03-14 LAB — RAPID URINE DRUG SCREEN, HOSP PERFORMED
AMPHETAMINES: NOT DETECTED
BARBITURATES: NOT DETECTED
BENZODIAZEPINES: NOT DETECTED
Cocaine: NOT DETECTED
Opiates: NOT DETECTED
Tetrahydrocannabinol: NOT DETECTED

## 2015-03-14 LAB — COMPREHENSIVE METABOLIC PANEL
ALBUMIN: 4.2 g/dL (ref 3.5–5.0)
ALT: 28 U/L (ref 17–63)
AST: 30 U/L (ref 15–41)
Alkaline Phosphatase: 74 U/L (ref 38–126)
Anion gap: 9 (ref 5–15)
BILIRUBIN TOTAL: 1.2 mg/dL (ref 0.3–1.2)
BUN: 17 mg/dL (ref 6–20)
CO2: 22 mmol/L (ref 22–32)
Calcium: 9.1 mg/dL (ref 8.9–10.3)
Chloride: 105 mmol/L (ref 101–111)
Creatinine, Ser: 0.9 mg/dL (ref 0.61–1.24)
GFR calc Af Amer: >60 mL/min (ref >60)
Glucose, Bld: 201 mg/dL — ABNORMAL HIGH (ref 65–99)
POTASSIUM: 4.6 mmol/L (ref 3.5–5.1)
Sodium: 136 mmol/L (ref 135–145)
Total Protein: 7.4 g/dL (ref 6.5–8.1)

## 2015-03-14 LAB — GLUCOSE, CAPILLARY
Glucose-Capillary: 111 mg/dL — ABNORMAL HIGH (ref 65–99)
Glucose-Capillary: 297 mg/dL — ABNORMAL HIGH (ref 65–99)

## 2015-03-14 LAB — ETHANOL: Alcohol, Ethyl (B): <5 mg/dL (ref <5)

## 2015-03-14 LAB — SALICYLATE LEVEL

## 2015-03-14 LAB — ACETAMINOPHEN LEVEL: Acetaminophen (Tylenol), Serum: <10 ug/mL — ABNORMAL LOW (ref 10–30)

## 2015-03-14 MED ORDER — NAPROXEN 250 MG PO TABS
250.0000 mg | ORAL_TABLET | Freq: Two times a day (BID) | ORAL | Status: DC
  Administered 2015-03-15: 250 mg via ORAL
  Filled 2015-03-14 (×3): qty 1

## 2015-03-14 MED ORDER — ONDANSETRON HCL 4 MG PO TABS: 4.0000 mg | ORAL_TABLET | Freq: Four times a day (QID) | ORAL | Status: DC | PRN

## 2015-03-14 MED ORDER — ADULT MULTIVITAMIN W/MINERALS CH
1.0000 | ORAL_TABLET | Freq: Every day | ORAL | Status: DC
  Administered 2015-03-15: 1 via ORAL
  Filled 2015-03-14: qty 1

## 2015-03-14 MED ORDER — CETYLPYRIDINIUM CHLORIDE 0.05 % MT LIQD
7.0000 mL | Freq: Two times a day (BID) | OROMUCOSAL | Status: DC
  Administered 2015-03-14 – 2015-03-15 (×2): 7 mL via OROMUCOSAL

## 2015-03-14 MED ORDER — ACETAMINOPHEN 325 MG PO TABS: 650.0000 mg | ORAL_TABLET | Freq: Four times a day (QID) | ORAL | Status: DC | PRN

## 2015-03-14 MED ORDER — SODIUM CHLORIDE 0.9 % IJ SOLN
3.0000 mL | Freq: Two times a day (BID) | INTRAMUSCULAR | Status: DC
  Administered 2015-03-15: 3 mL via INTRAVENOUS

## 2015-03-14 MED ORDER — ONDANSETRON HCL 4 MG/2ML IJ SOLN: 4.0000 mg | Freq: Four times a day (QID) | INTRAMUSCULAR | Status: DC | PRN

## 2015-03-14 MED ORDER — HYDROCODONE-ACETAMINOPHEN 7.5-325 MG PO TABS: 1.0000 | ORAL_TABLET | Freq: Four times a day (QID) | ORAL | Status: DC | PRN

## 2015-03-14 MED ORDER — LORAZEPAM 2 MG/ML IJ SOLN
1.0000 mg | INTRAMUSCULAR | Status: DC | PRN
  Administered 2015-03-14: 1 mg via INTRAVENOUS
  Filled 2015-03-14: qty 1

## 2015-03-14 MED ORDER — INSULIN ASPART 100 UNIT/ML ~~LOC~~ SOLN
0.0000 [IU] | Freq: Three times a day (TID) | SUBCUTANEOUS | Status: DC
Start: 2015-03-15 — End: 2015-03-15

## 2015-03-14 MED ORDER — ENOXAPARIN SODIUM 40 MG/0.4ML ~~LOC~~ SOLN
40.0000 mg | SUBCUTANEOUS | Status: DC
  Filled 2015-03-14 (×2): qty 0.4

## 2015-03-14 MED ORDER — INSULIN ASPART 100 UNIT/ML ~~LOC~~ SOLN: 0.0000 [IU] | Freq: Every day | SUBCUTANEOUS | Status: DC

## 2015-03-14 MED ORDER — METHOCARBAMOL 750 MG PO TABS
750.0000 mg | ORAL_TABLET | Freq: Two times a day (BID) | ORAL | Status: DC
  Administered 2015-03-14 – 2015-03-15 (×2): 750 mg via ORAL
  Filled 2015-03-14 (×4): qty 1

## 2015-03-14 MED ORDER — SODIUM CHLORIDE 0.9 % IV SOLN
INTRAVENOUS | Status: AC
  Administered 2015-03-14: 20:00:00 via INTRAVENOUS

## 2015-03-14 MED ORDER — ATORVASTATIN CALCIUM 20 MG PO TABS
20.0000 mg | ORAL_TABLET | Freq: Every day | ORAL | Status: DC
  Administered 2015-03-14 – 2015-03-15 (×2): 20 mg via ORAL
  Filled 2015-03-14 (×2): qty 1

## 2015-03-14 MED ORDER — ACETAMINOPHEN 650 MG RE SUPP: 650.0000 mg | Freq: Four times a day (QID) | RECTAL | Status: DC | PRN

## 2015-03-14 MED ORDER — LISINOPRIL 10 MG PO TABS
10.0000 mg | ORAL_TABLET | Freq: Every day | ORAL | Status: DC
Start: 2015-03-14 — End: 2015-03-15
  Administered 2015-03-15: 10 mg via ORAL
  Filled 2015-03-14: qty 1

## 2015-03-14 NOTE — ED Provider Notes (Signed)
CSN: [id number]     Arrival date & time 03/14/15  1247 History   First MD Initiated Contact with Patient 03/14/15 1326     Chief Complaint  Patient presents with  . Ingestion     (Consider location/radiation/quality/duration/timing/severity/associated sxs/prior Treatment) Patient is a 54 y.o. male presenting with Overdose. The history is provided by the patient. No language interpreter was used.  Drug Overdose This is a new problem. The current episode started 3 to 5 hours ago. The problem occurs rarely. The problem has not changed since onset.Pertinent negatives include no chest pain, no abdominal pain, no headaches and no shortness of breath. Nothing aggravates the symptoms. Nothing relieves the symptoms. He has tried nothing for the symptoms. The treatment provided no relief.    Past Medical History  Diagnosis Date  . Sleep apnea   . Arthritis   . Hyperlipidemia     takes Atorvastatin daily  . Insomnia     takes Trazodone at bedtime  . Diabetes mellitus without complication     takes Metformin and Amaryl daily  . Asthma     a "touch"  . Weakness     and numbness right hand  . Neck pain   . Urinary frequency   . History of kidney stones   . Panic attacks     but doesn't take any meds   Past Surgical History  Procedure Laterality Date  . Ankle surgery Left   . Arthroscopic repair acl Left   . Tonsillectomy      as a child  . Lithotripsy    . Anterior cervical decomp/discectomy fusion N/A 12/15/2014    Procedure: ANTERIOR CERVICAL DECOMPRESSION/DISCECTOMY FUSION 2 LEVELS;  Surgeon: Phylliss Bob, MD;  Location: [facility];  Service: Orthopedics;  Laterality: N/A;  Anterior cervical decompression fusion, cervical 5-6, cervical 6-7 with instrumentation and allograft   No family history on file. History  Substance Use Topics  . Smoking status: Current Some Day Smoker -- 25 years  . Smokeless tobacco: Never Used     Comment: smokes 1 cig a day  . Alcohol Use: No    Review  of Systems  Constitutional: Negative for fever, activity change, appetite change and fatigue.  HENT: Negative for congestion, facial swelling, rhinorrhea and trouble swallowing.   Eyes: Negative for photophobia and pain.  Respiratory: Negative for cough, chest tightness and shortness of breath.   Cardiovascular: Negative for chest pain and leg swelling.  Gastrointestinal: Negative for nausea, vomiting, abdominal pain, diarrhea and constipation.  Endocrine: Negative for polydipsia and polyuria.  Genitourinary: Negative for dysuria, urgency, decreased urine volume and difficulty urinating.  Musculoskeletal: Negative for back pain and gait problem.  Skin: Negative for color change, rash and wound.  Allergic/Immunologic: Negative for immunocompromised state.  Neurological: Positive for light-headedness. Negative for dizziness, facial asymmetry, speech difficulty, weakness, numbness and headaches.  Psychiatric/Behavioral: Negative for confusion, decreased concentration and agitation.      Allergies  Review of patient's allergies indicates no known allergies.  Home Medications   Prior to Admission medications   Medication Sig Start Date End Date Taking? Authorizing Provider  atorvastatin (LIPITOR) 20 MG tablet Take 20 mg by mouth daily.    Historical Provider, MD  fluconazole (DIFLUCAN) 200 MG tablet Take 1 tablet (200 mg total) by mouth daily. Patient not taking: Reported on 12/06/2014 06/13/13   Leone Haven, MD  glimepiride (AMARYL) 1 MG tablet Take 1 mg by mouth daily with breakfast.    Historical Provider, MD  lisinopril (PRINIVIL,ZESTRIL) 10 MG tablet Take 10 mg by mouth daily.    Historical Provider, MD  metFORMIN (GLUCOPHAGE) 500 MG tablet Take 750 mg by mouth 3 (three) times daily.    Historical Provider, MD  Multiple Vitamins-Minerals (MULTIVITAMIN WITH MINERALS) tablet Take 1 tablet by mouth daily.    Historical Provider, MD  terbinafine (LAMISIL) 250 MG tablet Take 250 mg by  mouth daily.    Historical Provider, MD  TRAZODONE HCL PO Take 1 tablet by mouth at bedtime.    Historical Provider, MD  varenicline (CHANTIX) 1 MG tablet Take 1 mg by mouth 2 (two) times daily.    Historical Provider, MD   BP 141/75 mmHg  Pulse 85  Temp(Src) 98.3 F (36.8 C) (Oral)  Resp 22  Ht 6' (1.829 m)  Wt 255 lb (115.667 kg)  BMI 34.58 kg/m2  SpO2 94% Physical Exam  Constitutional: He is oriented to person, place, and time. He appears well-developed and well-nourished. No distress.  HENT:  Head: Normocephalic and atraumatic.  Mouth/Throat: No oropharyngeal exudate.  Eyes: Pupils are equal, round, and reactive to light.  Neck: Normal range of motion. Neck supple.  Cardiovascular: Normal rate, regular rhythm and normal heart sounds.  Exam reveals no gallop and no friction rub.   No murmur heard. Pulmonary/Chest: Effort normal and breath sounds normal. No respiratory distress. He has no wheezes. He has no rales.  Abdominal: Soft. Bowel sounds are normal. He exhibits no distension and no mass. There is no tenderness. There is no rebound and no guarding.  Musculoskeletal: Normal range of motion. He exhibits no edema or tenderness.  Neurological: He is alert and oriented to person, place, and time. He has normal strength. He displays no atrophy and no tremor. No cranial nerve deficit or sensory deficit. He exhibits normal muscle tone. Coordination normal. GCS eye subscore is 4. GCS verbal subscore is 5. GCS motor subscore is 6.  Skin: Skin is warm and dry.  Psychiatric: He has a normal mood and affect.    ED Course  Procedures (including critical care time) Labs Review Labs Reviewed  COMPREHENSIVE METABOLIC PANEL - Abnormal; Notable for the following:    Glucose, Bld 201 (*)    All other components within normal limits  ACETAMINOPHEN LEVEL - Abnormal; Notable for the following:    Acetaminophen (Tylenol), Serum <10 (*)    All other components within normal limits  CBC WITH  DIFFERENTIAL/PLATELET  URINE RAPID DRUG SCREEN, HOSP PERFORMED  SALICYLATE LEVEL  ETHANOL    Imaging Review No results found.   EKG Interpretation   Date/Time:  Monday March 14 2015 13:38:57 EDT Ventricular Rate:  79 PR Interval:  171 QRS Duration: 101 QT Interval:  380 QTC Calculation: 436 R Axis:   36 Text Interpretation:  Sinus rhythm Abnormal R-wave progression, early  transition No significant change since last tracing Confirmed by [facility] [phone]) on 03/14/2015 2:16:03 PM      MDM   Final diagnoses:  Accidental overdose, initial encounter    Pt is a 54 y.o. male with Pmhx as above who presents with accidental overdose.  Patient states that he was just cleared by his neurosurgeon earlier today after having a cervical spinal fusion to be able to take NSAIDs.  He has been having some shoulder pain and asked his wife to give him some ibuprofen.  She accidentally gave him 3 300 mg extended release Bupropion tablets.  He started feeling strange about an hour later  and looked at the bottle and found that it was not ibuprofen as he thought.  On physical exam, vital signs are stable.  He is in no acute distress.  He denies that this was an act of self-harm and does not feel that his wife was trying to hurt him either.  Current pulmonary exam and neurologic exam are benign.   Poison control is recommending at least 24 hours of cardiac monitoring, as there have been cases of patient having seizures as far out as 20 hours after ingestion of this size.  They recommend supportive care.  Patient seizes first on his benzos, if he has recurrent seizures.  Recommendations are for loading with phenobarbital.   Will consult [facility] for admission.     Ernestina Patches, MD 03/14/15 1504

## 2015-03-14 NOTE — H&P (Signed)
[facility] History and Physical  MARCELO ICKES BCW:[id number] DOB: 12-11-1960 DOA: 03/14/2015  Referring physician:  Wilkie Aye PCP:  Enid Skeens., MD   Chief Complaint:  Accidental drug overdose  HPI:  The patient is a 54 y.o. year-old male with history of diabetes mellitus, hyperlipidemia, sleep apnea, asthma, panic attacks, arthritis status post recent cervical spine fusion in April 2016 who presents with accidental drug overdose.  The patient was seen by his surgeon this morning who cleared him to start taking NSAIDs for his arthritis.  He asked his wife for some ibuprofen and she dispensed 3 tabs of bupropion extended release 300 mg each thinking of a were ibuprofen. The patient did not feel relief an hour later as he expected so he looked at the bottle again.  He saw that there was no "i" before the "b" on the bottle, and realized that it was the wrong medication.  He read on the computer that he should come to the ER if he had an overdose.  He feels otherwise well except that his shoulder still hurts.  He denies seizures, lightheadedness, SOB, nausea, vomiting, diarrhea.    In the ER, labs were notable for glucose of 201.  Tylenol, aspirin, urine drug screens were all negative.  Poison control recommended monitoring for 24 hours for seizures since he took the extended release version of the medications.    Review of Systems:  General:  Denies fevers, chills, weight loss or gain HEENT:  Denies changes to hearing and vision, rhinorrhea, sinus congestion, sore throat CV:  Denies chest pain and palpitations, lower extremity edema.  PULM:  Denies SOB, wheezing, cough.   GI:  Denies nausea, vomiting, constipation, diarrhea.   GU:  Denies dysuria, frequency, urgency ENDO:  Denies polyuria, polydipsia.   HEME:  Denies hematemesis, blood in stools, melena, abnormal bruising or bleeding.  LYMPH:  Denies lymphadenopathy.   MSK:  Chronic arthralgias, myalgias.   DERM:  Denies  skin rash or ulcer.   NEURO:  Denies focal numbness, weakness, slurred speech, confusion, facial droop.  PSYCH:  Denies anxiety and depression.    Past Medical History  Diagnosis Date  . Sleep apnea     cpap  . Arthritis   . Hyperlipidemia     takes Atorvastatin daily  . Insomnia     takes Trazodone at bedtime  . Diabetes mellitus without complication     takes Metformin and Amaryl daily  . Asthma     a "touch"  . Weakness     and numbness right hand  . Neck pain   . Urinary frequency   . History of kidney stones   . Panic attacks     but doesn't take any meds  . Basal cell carcinoma   . Hypertension    Past Surgical History  Procedure Laterality Date  . Ankle surgery Left   . Arthroscopic repair acl Left   . Tonsillectomy      as a child  . Lithotripsy    . Anterior cervical decomp/discectomy fusion N/A 12/15/2014    Procedure: ANTERIOR CERVICAL DECOMPRESSION/DISCECTOMY FUSION 2 LEVELS;  Surgeon: Phylliss Bob, MD;  Location: [facility];  Service: Orthopedics;  Laterality: N/A;  Anterior cervical decompression fusion, cervical 5-6, cervical 6-7 with instrumentation and allograft   Social History:  reports that he quit smoking about 2 months ago. His smoking use included Cigarettes. He has a 50 pack-year smoking history. He has never used smokeless tobacco. He reports that he does  not drink alcohol or use illicit drugs.  No Known Allergies  Family History  Problem Relation Age of Onset  . Alcohol abuse    . Diabetes    . Seizures Brother     started in early teens     Prior to Admission medications   Medication Sig Start Date End Date Taking? Authorizing Provider  methocarbamol (ROBAXIN) 750 MG tablet Take 750 mg by mouth 2 (two) times daily.   Yes Historical Provider, MD  atorvastatin (LIPITOR) 20 MG tablet Take 20 mg by mouth daily.    Historical Provider, MD  glimepiride (AMARYL) 1 MG tablet Take 1 mg by mouth daily with breakfast.    Historical Provider, MD   lisinopril (PRINIVIL,ZESTRIL) 10 MG tablet Take 10 mg by mouth daily.    Historical Provider, MD  metFORMIN (GLUCOPHAGE) 500 MG tablet Take 750 mg by mouth 3 (three) times daily.    Historical Provider, MD  Multiple Vitamins-Minerals (MULTIVITAMIN WITH MINERALS) tablet Take 1 tablet by mouth daily.    Historical Provider, MD  TRAZODONE HCL PO Take 1 tablet by mouth at bedtime.    Historical Provider, MD   Physical Exam: Filed Vitals:   03/14/15 1258 03/14/15 1422  BP: 142/85 141/75  Pulse: 91 85  Temp: 98.3 F (36.8 C)   TempSrc: Oral   Resp: 18 22  Height: 6' (1.829 m)   Weight: 115.667 kg (255 lb)   SpO2: 97% 94%     General:  Adult male, NAD  Eyes:  PERRL, anicteric, non-injected.  ENT:  Nares clear.  OP clear, non-erythematous without plaques or exudates.  MMM.  Neck:  Supple without TM or JVD.    Lymph:  No cervical, supraclavicular, or submandibular LAD.  Cardiovascular:  RRR, normal S1, S2, without m/r/g.  2+ pulses, warm extremities  Respiratory:  CTA bilaterally without increased WOB.  Abdomen:  NABS.  Soft, ND/NT.    Skin:  Healing area on anterior chest where skin cancer was excised  Musculoskeletal:  Normal bulk and tone.  No LE edema.  Psychiatric:  A & O x 4.  Appropriate affect.  Neurologic:  CN 3-12 intact.  5/5 strength except LUE 4/5 secondary to pain.  Sensation intact.  Labs on Admission:  Basic Metabolic Panel:  Recent Labs Lab 03/14/15 1402  NA 136  K 4.6  CL 105  CO2 22  GLUCOSE 201*  BUN 17  CREATININE 0.90  CALCIUM 9.1   Liver Function Tests:  Recent Labs Lab 03/14/15 1402  AST 30  ALT 28  ALKPHOS 74  BILITOT 1.2  PROT 7.4  ALBUMIN 4.2   No results for input(s): LIPASE, AMYLASE in the last 168 hours. No results for input(s): AMMONIA in the last 168 hours. CBC:  Recent Labs Lab 03/14/15 1402  WBC 7.6  NEUTROABS 4.1  HGB 13.3  HCT 41.5  MCV 89.6  PLT 267   Cardiac Enzymes: No results for input(s): CKTOTAL,  CKMB, CKMBINDEX, TROPONINI in the last 168 hours.  BNP (last 3 results) No results for input(s): BNP in the last 8760 hours.  ProBNP (last 3 results) No results for input(s): PROBNP in the last 8760 hours.  CBG: No results for input(s): GLUCAP in the last 168 hours.  Radiological Exams on Admission: No results found.  EKG: Independently reviewed.  NSR, early R-wave progression  Assessment/Plan Principal Problem:   Accidental drug overdose Active Problems:   OSA (obstructive sleep apnea)   Accidental overdose   Diabetes mellitus without  complication   Hypertension   Arthritis   Hyperlipidemia  ---  Accidental overdose with bupropion which lowers the seizure threshold.  Patient does not have a person history of seizure, but does have a brother with epilepsy. -  Telemetry -  Seizure precautions until tomorrow at 11AM -  q4h neuro checks -  Ativan prn -  Avoid ultram/FQ and other medications which may also lower sz threshold  Diabetes mellitus type 2 -  Hold home meds -  Low dose SSI   HTN/HLD, blood pressure mildly elevated -  Continue lisinopril and atorvastatin  OSA, stable, continue CPAP  Arthritis with left shoulder pain -  Start naprosyn with vicodin for breakthrough pain only -  Continue robaxin   Diet:  diabetic Access:  PIV IVF:  off Proph:  lovenox  Code Status: full Family Communication: patient and his daughter Disposition Plan: Admit to telemetry under observation.  Plan to d/c tomorrow if medically stable around lunchtime.  Time spent: 60 min Janece Canterbury [facility] Pager [phone]  If 7PM-7AM, please contact night-coverage [email or web address] Password [facility] 03/14/2015, 4:03 PM

## 2015-03-14 NOTE — ED Notes (Signed)
Pt states that he thought he was taking ibuprofen for his neck pain and his wife handed him (3) Wellbutrin 300mg .  Poison control was contacted by pt and notified Agricultural consultant here.

## 2015-03-14 NOTE — ED Notes (Signed)
MD at bedside. 

## 2015-03-14 NOTE — ED Notes (Signed)
Poison control notified.  Pt states that he took 3 tabs of 300 mg Wellbutrin XR.  Due to meds being extended release, minimum observation of 24 hrs for this patient.  Watch for seizures, give benzos if needed.  Cardiac monitoring.  Give charcoal w/o sorbitol if pt arrives before 1330.

## 2015-03-15 DIAGNOSIS — T50901D Poisoning by unspecified drugs, medicaments and biological substances, accidental (unintentional), subsequent encounter: Secondary | ICD-10-CM

## 2015-03-15 LAB — GLUCOSE, CAPILLARY
GLUCOSE-CAPILLARY: 191 mg/dL — AB (ref 65–99)
Glucose-Capillary: 186 mg/dL — ABNORMAL HIGH (ref 65–99)

## 2015-03-15 NOTE — Discharge Summary (Signed)
Physician Discharge Summary  Edward Santiago QHU:[id number] DOB: January 29, 1961 DOA: 03/14/2015  PCP: Enid Skeens., MD  Admit date: 03/14/2015 Discharge date: 03/15/2015  Recommendations for Outpatient Follow-up:  1. F/u with PCP as needed for ongoing management of chronic medical problems  Discharge Diagnoses:  Principal Problem:   Accidental drug overdose Active Problems:   OSA (obstructive sleep apnea)   Accidental overdose   Diabetes mellitus without complication   Hypertension   Arthritis   Hyperlipidemia   Discharge Condition: stable, improved  Diet recommendation: diabetic  Wt Readings from Last 3 Encounters:  03/14/15 115.667 kg (255 lb)  12/15/14 117.113 kg (258 lb 3 oz)  12/08/14 117.119 kg (258 lb 3.2 oz)    History of present illness:  The patient is a 54 y.o. year-old male with history of diabetes mellitus, hyperlipidemia, sleep apnea, asthma, panic attacks, arthritis status post recent cervical spine fusion in April 2016 who presented with accidental drug overdose. The patient was seen by his surgeon the morning of admission who cleared him to start taking NSAIDs for his arthritis. He asked his wife for some ibuprofen and she dispensed 3 tabs of bupropion extended release 300 mg thinking they were ibuprofen. The patient did not feel relief from pain an hour later as he expected so he looked at the bottle again. He saw that there was no "i" before the "b" on the bottle, and realized that it was the wrong medication. He read on the computer that he should come to the ER if he had an overdose. At the time of admission, he denied seizures, lightheadedness, SOB, nausea, vomiting, diarrhea. In the ER, labs were notable for glucose of 201. Tylenol, aspirin, urine drug screens were all negative. Poison control recommended monitoring for 24 hours for seizures since he took the extended release version of the medications.   Hospital Course:   Accidental overdose with  bupropion which lowers the seizure threshold. Patient does not have a person history of seizure, but does have a brother with epilepsy.  He had no seizures in 24 hours.  Telemetry demonstrated no evidence of arrhythmia.  He was discharged home with instructions to 1. Learn all of his medications, 2. Keep a list of his medications with him, and 3. Be more careful about taking medications in the future.    Diabetes mellitus type 2, HTN/HLD, OSA, and arthritis with left shoulder pain all remained stable.  No changes were made to his home medications.  Advised to use ibuprofen as needed.    Procedures:  none  Consultations:  Poison control  Discharge Exam: Filed Vitals:   03/15/15 0557  BP: 121/81  Pulse: 66  Temp: 97.7 F (36.5 C)  Resp: 18   Filed Vitals:   03/14/15 1900 03/14/15 2025 03/14/15 2116 03/15/15 0557  BP: 137/82  132/69 121/81  Pulse: 80 84 80 66  Temp: 98.5 F (36.9 C)  98.1 F (36.7 C) 97.7 F (36.5 C)  TempSrc: Oral  Oral Oral  Resp: 18 17 18 18   Height:      Weight:      SpO2: 97%  97% 99%    General: adult male, NAD Cardiovascular: RRR Respiratory: CTAB, no increased WOB ABD:  NABS, soft, ND/ND MSK:  No LEE Neuro:  No focal deficits except left arm limited by pain  Discharge Instructions      Discharge Instructions    Diet - low sodium heart healthy    Complete by:  As directed  Diet Carb Modified    Complete by:  As directed      Discharge instructions    Complete by:  As directed   Please 1. Learn all of your medications, 2. Keep a list of your medications with you at all times, and 3. Be more careful about what medications you are taking in the future.     Increase activity slowly    Complete by:  As directed             Medication List    TAKE these medications        atorvastatin 20 MG tablet  Commonly known as:  LIPITOR  Take 20 mg by mouth daily.     diazepam 5 MG tablet  Commonly known as:  VALIUM  Take 5 mg by mouth  daily as needed for anxiety.     glimepiride 1 MG tablet  Commonly known as:  AMARYL  Take 1 mg by mouth daily with breakfast.     HYDROcodone-acetaminophen 7.5-325 MG per tablet  Commonly known as:  NORCO  Take 1 tablet by mouth every 6 (six) hours as needed for moderate pain.     lisinopril 10 MG tablet  Commonly known as:  PRINIVIL,ZESTRIL  Take 10 mg by mouth daily.     metFORMIN 500 MG tablet  Commonly known as:  GLUCOPHAGE  Take 750 mg by mouth 3 (three) times daily.     methocarbamol 750 MG tablet  Commonly known as:  ROBAXIN  Take 750 mg by mouth 2 (two) times daily.     multivitamin with minerals tablet  Take 1 tablet by mouth daily.       Follow-up Information    Follow up with SLATOSKY,JOHN J., MD. Schedule an appointment as soon as possible for a visit in 1 week.   Specialty:  Family Medicine   Contact information:   [address] [address] [phone]        The results of significant diagnostics from this hospitalization (including imaging, microbiology, ancillary and laboratory) are listed below for reference.    Significant Diagnostic Studies: No results found.  Microbiology: No results found for this or any previous visit (from the past 240 hour(s)).   Labs: Basic Metabolic Panel:  Recent Labs Lab 03/14/15 1402  NA 136  K 4.6  CL 105  CO2 22  GLUCOSE 201*  BUN 17  CREATININE 0.90  CALCIUM 9.1   Liver Function Tests:  Recent Labs Lab 03/14/15 1402  AST 30  ALT 28  ALKPHOS 74  BILITOT 1.2  PROT 7.4  ALBUMIN 4.2   No results for input(s): LIPASE, AMYLASE in the last 168 hours. No results for input(s): AMMONIA in the last 168 hours. CBC:  Recent Labs Lab 03/14/15 1402  WBC 7.6  NEUTROABS 4.1  HGB 13.3  HCT 41.5  MCV 89.6  PLT 267   Cardiac Enzymes: No results for input(s): CKTOTAL, CKMB, CKMBINDEX, TROPONINI in the last 168 hours. BNP: BNP (last 3 results) No results for input(s): BNP in the last  8760 hours.  ProBNP (last 3 results) No results for input(s): PROBNP in the last 8760 hours.  CBG:  Recent Labs Lab 03/14/15 1900 03/14/15 2224 03/15/15 0751 03/15/15 1128  GLUCAP 111* 297* 191* 186*    Time coordinating discharge: 20 minutes  Signed:  Fannie Alomar  [facility] 03/15/2015, 11:48 AM

## 2015-03-15 NOTE — Progress Notes (Signed)
Date:  March 15, 2015 U.R. performed for needs and level of care. Will continue to follow for Case Management needs.  Velva Harman, RN, BSN, [place]   [phone]

## 2015-03-15 NOTE — Progress Notes (Signed)
[facility] to be D/C'd Home per MD order.  Discussed prescriptions and follow up appointments with the patient. Prescriptions given to patient, medication list explained in detail. Pt verbalized understanding.    Medication List    TAKE these medications        atorvastatin 20 MG tablet  Commonly known as:  LIPITOR  Take 20 mg by mouth daily.     diazepam 5 MG tablet  Commonly known as:  VALIUM  Take 5 mg by mouth daily as needed for anxiety.     glimepiride 1 MG tablet  Commonly known as:  AMARYL  Take 1 mg by mouth daily with breakfast.     HYDROcodone-acetaminophen 7.5-325 MG per tablet  Commonly known as:  NORCO  Take 1 tablet by mouth every 6 (six) hours as needed for moderate pain.     lisinopril 10 MG tablet  Commonly known as:  PRINIVIL,ZESTRIL  Take 10 mg by mouth daily.     metFORMIN 500 MG tablet  Commonly known as:  GLUCOPHAGE  Take 750 mg by mouth 3 (three) times daily.     methocarbamol 750 MG tablet  Commonly known as:  ROBAXIN  Take 750 mg by mouth 2 (two) times daily.     multivitamin with minerals tablet  Take 1 tablet by mouth daily.        Filed Vitals:   03/15/15 0557  BP: 121/81  Pulse: 66  Temp: 97.7 F (36.5 C)  Resp: 18    Skin clean, dry and intact without evidence of skin break down, no evidence of skin tears noted. IV catheter discontinued intact. Site without signs and symptoms of complications. Dressing and pressure applied. Pt denies pain at this time. No complaints noted.  An After Visit Summary was printed and given to the patient. Patient escorted via [place], and D/C home via private auto.  Nonie Hoyer S 03/15/2015 2:05 PM

## 2015-03-15 NOTE — Care Management Note (Signed)
Case Management Note  Patient Details  Name: COVEY BALLER MRN: [id number] Date of Birth: 1961/04/15  Subjective/Objective:         Accidental overdose           Action/Plan:  home   Expected Discharge Date:   (unknown)       [id number]        Expected Discharge Plan:  Home/Self Care  In-House Referral:  Clinical Social Work  Discharge planning Services  CM Consult  Post Acute Care Choice:  NA Choice offered to:  NA  DME Arranged:  N/A DME Agency:  NA  HH Arranged:  NA HH Agency:  NA  Status of Service:  Completed, signed off  Medicare Important Message Given:    Date Medicare IM Given:    Medicare IM give by:    Date Additional Medicare IM Given:    Additional Medicare Important Message give by:     If discussed at [place] of Stay Meetings, dates discussed:    Additional Comments:  Leeroy Cha, RN 03/15/2015, 12:02 PM

## 2015-05-11 ENCOUNTER — Other Ambulatory Visit: Payer: Self-pay | Admitting: Orthopedic Surgery

## 2015-05-19 ENCOUNTER — Encounter (HOSPITAL_BASED_OUTPATIENT_CLINIC_OR_DEPARTMENT_OTHER): Payer: Self-pay | Admitting: *Deleted

## 2015-05-23 ENCOUNTER — Encounter (HOSPITAL_BASED_OUTPATIENT_CLINIC_OR_DEPARTMENT_OTHER)
Admission: RE | Admit: 2015-05-23 | Discharge: 2015-05-23 | Disposition: A | Discharge status: Home / Self Care | Payer: No Typology Code available for payment source | Source: Ambulatory Visit | Attending: Orthopedic Surgery | Admitting: Orthopedic Surgery

## 2015-05-23 DIAGNOSIS — E785 Hyperlipidemia, unspecified: Secondary | ICD-10-CM | POA: Diagnosis not present

## 2015-05-23 DIAGNOSIS — E119 Type 2 diabetes mellitus without complications: Secondary | ICD-10-CM | POA: Diagnosis not present

## 2015-05-23 DIAGNOSIS — I10 Essential (primary) hypertension: Secondary | ICD-10-CM | POA: Diagnosis not present

## 2015-05-23 DIAGNOSIS — Z87891 Personal history of nicotine dependence: Secondary | ICD-10-CM | POA: Diagnosis not present

## 2015-05-23 DIAGNOSIS — Z79899 Other long term (current) drug therapy: Secondary | ICD-10-CM | POA: Diagnosis not present

## 2015-05-23 DIAGNOSIS — G5601 Carpal tunnel syndrome, right upper limb: Secondary | ICD-10-CM | POA: Diagnosis present

## 2015-05-23 DIAGNOSIS — Z6834 Body mass index (BMI) 34.0-34.9, adult: Secondary | ICD-10-CM | POA: Diagnosis not present

## 2015-05-23 NOTE — H&P (Signed)
Edward Santiago is an 54 y.o. male.   CC / Reason for Visit: Left shoulder problem; right carpal tunnel syndrome HPI:  This patient returns today for followup of his left shoulder problem.  He reports that the injection into the anterior portion of the shoulder seemed to help somewhat but he is having some posterior pain now.  He is reports that he has not yet scheduled his carpal tunnel release and would like to ask a couple more questions prior to scheduling surgery.  Furthermore he has begun taking diclofenac as opposed to Mobic which was prescribed by Dr. Lynann Bologna.  He feels that this is more helpful than meloxicam.  HPI 03/31/2015: Patient is a 54 year old, right-hand dominant, [employer] representative who was diagnosed with severe right carpal tunnel syndrome per nerve conduction studies done on 10/06/2014.  He also reports that he's had some long-standing left shoulder pain for which he has been taking meloxicam on occasion.  He further indicates that there is some pain with forward elevation and points to the center of his biceps as well as up around his biceps tendon.  He indicates that he has had no treatment for this problem.  Past Medical History  Diagnosis Date  . Sleep apnea     cpap  . Arthritis   . Hyperlipidemia     takes Atorvastatin daily  . Insomnia     takes Trazodone at bedtime  . Diabetes mellitus without complication     takes Metformin and Amaryl daily  . Asthma     a "touch"  . Weakness     and numbness right hand  . Neck pain   . Urinary frequency   . History of kidney stones   . Panic attacks     but doesn't take any meds  . Basal cell carcinoma   . Hypertension     Past Surgical History  Procedure Laterality Date  . Ankle surgery Left   . Arthroscopic repair acl Left   . Tonsillectomy      as a child  . Lithotripsy    . Anterior cervical decomp/discectomy fusion N/A 12/15/2014    Procedure: ANTERIOR CERVICAL DECOMPRESSION/DISCECTOMY FUSION 2 LEVELS;   Surgeon: Phylliss Bob, MD;  Location: [facility];  Service: Orthopedics;  Laterality: N/A;  Anterior cervical decompression fusion, cervical 5-6, cervical 6-7 with instrumentation and allograft    Family History  Problem Relation Age of Onset  . Alcohol abuse    . Diabetes    . Seizures Brother     started in early teens   Social History:  reports that he quit smoking about 4 months ago. His smoking use included Cigarettes. He has a 50 pack-year smoking history. He has never used smokeless tobacco. He reports that he does not drink alcohol or use illicit drugs.  Allergies: No Known Allergies  No prescriptions prior to admission    No results found for this or any previous visit (from the past 48 hour(s)). No results found.  Review of Systems  All other systems reviewed and are negative.   Height 6' (1.829 m), weight 115.667 kg (255 lb). Physical Exam  Constitutional:  WD, WN, NAD HEENT:  NCAT, EOMI Neuro/Psych:  Alert & oriented to person, place, and time; appropriate mood & affect Lymphatic: No generalized UE edema or lymphadenopathy Extremities / MSK:  Both UE are normal with respect to appearance, ranges of motion, joint stability, muscle strength/tone, sensation, & perfusion except as otherwise noted:  The left  shoulder remains grossly normal in appearance without swelling, discoloration, or deformity.  There is minimal to no pain over the proximal biceps tendon but when the upper trapezius is palpated distally, the patient has pain.    The right hand is also normal in appearance without any thenar wasting.  And overall evaluation is grossly unchanged from last visit.  There continues to be some altered sensibility into the thumb index long and half the ring of the right hand.  Labs / Xrays:  No radiographic studies obtained today.  Assessment: 1.  Probable left shoulder proximal biceps tendinitis 2.  Right carpal tunnel syndrome  Plan:  Findings were discussed with the  patient.  For his left shoulder, he was encouraged to take diclofenac as prescribed on the bottle.  He was also shown some upper trapezius and levator scapula he stretches.For his right carpal tunnel syndrome, he plans to schedule endoscopic carpal tunnel release in the near future.  The details of the operative procedure were discussed with the patient.  Questions were invited and answered.  In addition to the goal of the procedure, the risks of the procedure to include but not limited to bleeding; infection; damage to the nerves or blood vessels that could result in bleeding, numbness, weakness, chronic pain, and the need for additional procedures; stiffness; the need for revision surgery; and anesthetic risks were reviewed.  No specific outcome was guaranteed or implied.  Informed consent was obtained.  RTC when necessary.  THOMPSON, DAVID A. 05/23/2015, 3:47 PM

## 2015-05-24 ENCOUNTER — Encounter (HOSPITAL_BASED_OUTPATIENT_CLINIC_OR_DEPARTMENT_OTHER)
Admission: RE | Disposition: A | Discharge status: Home / Self Care | Payer: Self-pay | Source: Ambulatory Visit | Attending: Orthopedic Surgery

## 2015-05-24 ENCOUNTER — Encounter (HOSPITAL_BASED_OUTPATIENT_CLINIC_OR_DEPARTMENT_OTHER): Payer: Self-pay | Admitting: Certified Registered"

## 2015-05-24 ENCOUNTER — Ambulatory Visit (HOSPITAL_BASED_OUTPATIENT_CLINIC_OR_DEPARTMENT_OTHER)
Admission: RE | Admit: 2015-05-24 | Discharge: 2015-05-24 | Disposition: A | Discharge status: Home / Self Care | Payer: No Typology Code available for payment source | Source: Ambulatory Visit | Attending: Orthopedic Surgery | Admitting: Orthopedic Surgery

## 2015-05-24 ENCOUNTER — Ambulatory Visit (HOSPITAL_BASED_OUTPATIENT_CLINIC_OR_DEPARTMENT_OTHER): Payer: No Typology Code available for payment source | Admitting: Certified Registered"

## 2015-05-24 DIAGNOSIS — Z79899 Other long term (current) drug therapy: Secondary | ICD-10-CM | POA: Insufficient documentation

## 2015-05-24 DIAGNOSIS — E119 Type 2 diabetes mellitus without complications: Secondary | ICD-10-CM | POA: Insufficient documentation

## 2015-05-24 DIAGNOSIS — Z6834 Body mass index (BMI) 34.0-34.9, adult: Secondary | ICD-10-CM | POA: Insufficient documentation

## 2015-05-24 DIAGNOSIS — Z87891 Personal history of nicotine dependence: Secondary | ICD-10-CM | POA: Insufficient documentation

## 2015-05-24 DIAGNOSIS — I10 Essential (primary) hypertension: Secondary | ICD-10-CM | POA: Insufficient documentation

## 2015-05-24 DIAGNOSIS — G5601 Carpal tunnel syndrome, right upper limb: Secondary | ICD-10-CM | POA: Insufficient documentation

## 2015-05-24 DIAGNOSIS — E785 Hyperlipidemia, unspecified: Secondary | ICD-10-CM | POA: Insufficient documentation

## 2015-05-24 HISTORY — PX: CARPAL TUNNEL RELEASE: SHX101

## 2015-05-24 LAB — GLUCOSE, CAPILLARY: GLUCOSE-CAPILLARY: 174 mg/dL — AB (ref 65–99)

## 2015-05-24 LAB — POCT I-STAT, CHEM 8
BUN: 21 mg/dL — AB (ref 6–20)
CALCIUM ION: 1.12 mmol/L (ref 1.12–1.23)
CHLORIDE: 106 mmol/L (ref 101–111)
CREATININE: 0.6 mg/dL — AB (ref 0.61–1.24)
GLUCOSE: 198 mg/dL — AB (ref 65–99)
HCT: 45 % (ref 39.0–52.0)
Hemoglobin: 15.3 g/dL (ref 13.0–17.0)
Potassium: 4.3 mmol/L (ref 3.5–5.1)
Sodium: 139 mmol/L (ref 135–145)
TCO2: 23 mmol/L (ref 0–100)

## 2015-05-24 SURGERY — RELEASE, CARPAL TUNNEL, ENDOSCOPIC
Anesthesia: Monitor Anesthesia Care | Site: Wrist | Laterality: Right

## 2015-05-24 MED ORDER — LACTATED RINGERS IV SOLN
INTRAVENOUS | Status: DC
  Administered 2015-05-24: 11:00:00 via INTRAVENOUS

## 2015-05-24 MED ORDER — PROPOFOL 500 MG/50ML IV EMUL
INTRAVENOUS | Status: DC | PRN
  Administered 2015-05-24: 100 ug/kg/min via INTRAVENOUS

## 2015-05-24 MED ORDER — MIDAZOLAM HCL 2 MG/2ML IJ SOLN: 0.5000 mg | Freq: Once | INTRAMUSCULAR | Status: DC | PRN

## 2015-05-24 MED ORDER — PROMETHAZINE HCL 25 MG/ML IJ SOLN: 6.2500 mg | INTRAMUSCULAR | Status: DC | PRN

## 2015-05-24 MED ORDER — MEPERIDINE HCL 25 MG/ML IJ SOLN: 6.2500 mg | INTRAMUSCULAR | Status: DC | PRN

## 2015-05-24 MED ORDER — BUPIVACAINE-EPINEPHRINE (PF) 0.5% -1:200000 IJ SOLN
INTRAMUSCULAR | Status: DC | PRN
  Administered 2015-05-24: 5 mL

## 2015-05-24 MED ORDER — MIDAZOLAM HCL 5 MG/5ML IJ SOLN
INTRAMUSCULAR | Status: DC | PRN
  Administered 2015-05-24: 2 mg via INTRAVENOUS

## 2015-05-24 MED ORDER — ONDANSETRON HCL 4 MG/2ML IJ SOLN
INTRAMUSCULAR | Status: AC
  Filled 2015-05-24: qty 2

## 2015-05-24 MED ORDER — LIDOCAINE HCL 2 % IJ SOLN
INTRAMUSCULAR | Status: AC
  Filled 2015-05-24: qty 20

## 2015-05-24 MED ORDER — CEFAZOLIN SODIUM-DEXTROSE 2-3 GM-% IV SOLR
2.0000 g | INTRAVENOUS | Status: AC
  Administered 2015-05-24: 2 g via INTRAVENOUS

## 2015-05-24 MED ORDER — LACTATED RINGERS IV SOLN: INTRAVENOUS | Status: DC

## 2015-05-24 MED ORDER — LIDOCAINE HCL (CARDIAC) 20 MG/ML IV SOLN
INTRAVENOUS | Status: DC | PRN
  Administered 2015-05-24: 25 mg via INTRAVENOUS

## 2015-05-24 MED ORDER — SCOPOLAMINE 1 MG/3DAYS TD PT72: 1.0000 | MEDICATED_PATCH | Freq: Once | TRANSDERMAL | Status: DC | PRN

## 2015-05-24 MED ORDER — FENTANYL CITRATE (PF) 100 MCG/2ML IJ SOLN: 25.0000 ug | INTRAMUSCULAR | Status: DC | PRN

## 2015-05-24 MED ORDER — BUPIVACAINE-EPINEPHRINE (PF) 0.5% -1:200000 IJ SOLN
INTRAMUSCULAR | Status: AC
Start: 2015-05-24 — End: 2015-05-24
  Filled 2015-05-24: qty 30

## 2015-05-24 MED ORDER — HYDROCODONE-ACETAMINOPHEN 5-325 MG PO TABS: 1.0000 | ORAL_TABLET | Freq: Four times a day (QID) | ORAL | Status: DC | PRN

## 2015-05-24 MED ORDER — MIDAZOLAM HCL 2 MG/2ML IJ SOLN
INTRAMUSCULAR | Status: AC
  Filled 2015-05-24: qty 4

## 2015-05-24 MED ORDER — CEFAZOLIN SODIUM-DEXTROSE 2-3 GM-% IV SOLR
INTRAVENOUS | Status: AC
  Filled 2015-05-24: qty 50

## 2015-05-24 MED ORDER — ONDANSETRON HCL 4 MG/2ML IJ SOLN
INTRAMUSCULAR | Status: DC | PRN
  Administered 2015-05-24: 4 mg via INTRAVENOUS

## 2015-05-24 MED ORDER — LIDOCAINE HCL 2 % IJ SOLN
INTRAMUSCULAR | Status: DC | PRN
  Administered 2015-05-24: 5 mL

## 2015-05-24 MED ORDER — GLYCOPYRROLATE 0.2 MG/ML IJ SOLN: 0.2000 mg | Freq: Once | INTRAMUSCULAR | Status: DC | PRN

## 2015-05-24 MED ORDER — FENTANYL CITRATE (PF) 100 MCG/2ML IJ SOLN
INTRAMUSCULAR | Status: AC
  Filled 2015-05-24: qty 4

## 2015-05-24 MED ORDER — PROPOFOL 10 MG/ML IV BOLUS
INTRAVENOUS | Status: DC | PRN
  Administered 2015-05-24: 30 mg via INTRAVENOUS

## 2015-05-24 MED ORDER — FENTANYL CITRATE (PF) 100 MCG/2ML IJ SOLN
INTRAMUSCULAR | Status: DC | PRN
  Administered 2015-05-24 (×2): 50 ug via INTRAVENOUS

## 2015-05-24 SURGICAL SUPPLY — 44 items
APPLICATOR COTTON TIP 6IN STRL (MISCELLANEOUS) ×3 IMPLANT
BLADE HOOK ENDO STRL (BLADE) ×3 IMPLANT
BLADE SURG 15 STRL LF DISP TIS (BLADE) ×1 IMPLANT
BLADE SURG 15 STRL SS (BLADE) ×3
BLADE TRIANGLE EPF/EGR ENDO (BLADE) ×3 IMPLANT
BNDG CMPR 9X4 STRL LF SNTH (GAUZE/BANDAGES/DRESSINGS) ×1
BNDG COHESIVE 4X5 TAN STRL (GAUZE/BANDAGES/DRESSINGS) ×3 IMPLANT
BNDG ESMARK 4X9 LF (GAUZE/BANDAGES/DRESSINGS) ×3 IMPLANT
BNDG GAUZE ELAST 4 BULKY (GAUZE/BANDAGES/DRESSINGS) ×3 IMPLANT
CHLORAPREP W/TINT 26ML (MISCELLANEOUS) ×3 IMPLANT
CORDS BIPOLAR (ELECTRODE) IMPLANT
COVER BACK TABLE 60X90IN (DRAPES) ×3 IMPLANT
COVER MAYO STAND STRL (DRAPES) ×3 IMPLANT
CUFF TOURNIQUET SINGLE 18IN (TOURNIQUET CUFF) IMPLANT
DRAIN TLS ROUND 10FR (DRAIN) ×3 IMPLANT
DRAPE EXTREMITY T 121X128X90 (DRAPE) ×3 IMPLANT
DRAPE SURG 17X23 STRL (DRAPES) ×3 IMPLANT
DRSG EMULSION OIL 3X3 NADH (GAUZE/BANDAGES/DRESSINGS) ×3 IMPLANT
GLOVE BIO SURGEON STRL SZ 6.5 (GLOVE) ×1 IMPLANT
GLOVE BIO SURGEON STRL SZ7.5 (GLOVE) ×3 IMPLANT
GLOVE BIO SURGEONS STRL SZ 6.5 (GLOVE) ×1
GLOVE BIOGEL PI IND STRL 7.0 (GLOVE) ×1 IMPLANT
GLOVE BIOGEL PI IND STRL 8 (GLOVE) ×1 IMPLANT
GLOVE BIOGEL PI INDICATOR 7.0 (GLOVE) ×6
GLOVE BIOGEL PI INDICATOR 8 (GLOVE) ×2
GLOVE ECLIPSE 6.5 STRL STRAW (GLOVE) ×3 IMPLANT
GOWN STRL REUS W/ TWL LRG LVL3 (GOWN DISPOSABLE) ×2 IMPLANT
GOWN STRL REUS W/TWL LRG LVL3 (GOWN DISPOSABLE) ×6
GOWN STRL REUS W/TWL XL LVL3 (GOWN DISPOSABLE) ×3 IMPLANT
NDL HYPO 25X1 1.5 SAFETY (NEEDLE) IMPLANT
NEEDLE HYPO 25X1 1.5 SAFETY (NEEDLE) IMPLANT
NS IRRIG 1000ML POUR BTL (IV SOLUTION) ×3 IMPLANT
PACK BASIN DAY SURGERY FS (CUSTOM PROCEDURE TRAY) ×3 IMPLANT
PADDING CAST ABS 4INX4YD NS (CAST SUPPLIES)
PADDING CAST ABS COTTON 4X4 ST (CAST SUPPLIES) IMPLANT
SPONGE GAUZE 4X4 12PLY STER LF (GAUZE/BANDAGES/DRESSINGS) ×3 IMPLANT
STOCKINETTE 6  STRL (DRAPES) ×2
STOCKINETTE 6 STRL (DRAPES) ×1 IMPLANT
SUT VICRYL RAPIDE 4-0 (SUTURE) ×3 IMPLANT
SYR BULB 3OZ (MISCELLANEOUS) ×3 IMPLANT
SYRINGE 10CC LL (SYRINGE) IMPLANT
TOWEL OR 17X24 6PK STRL BLUE (TOWEL DISPOSABLE) ×6 IMPLANT
TOWEL OR NON WOVEN STRL DISP B (DISPOSABLE) ×3 IMPLANT
UNDERPAD 30X30 (UNDERPADS AND DIAPERS) ×3 IMPLANT

## 2015-05-24 NOTE — Anesthesia Procedure Notes (Signed)
Procedure Name: MAC Date/Time: 05/24/2015 12:22 PM Performed by: Baxter Flattery Pre-anesthesia Checklist: Patient identified, Emergency Drugs available, Suction available and Patient being monitored Patient Re-evaluated:Patient Re-evaluated prior to inductionOxygen Delivery Method: Simple face mask Preoxygenation: Pre-oxygenation with 100% oxygen Intubation Type: IV induction Dental Injury: Teeth and Oropharynx as per pre-operative assessment

## 2015-05-24 NOTE — Interval H&P Note (Signed)
History and Physical Interval Note:  05/24/2015 12:14 PM  Edward Santiago  has presented today for surgery, with the diagnosis of RIGHT CARPAL TUNNEL SYNDROME  The various methods of treatment have been discussed with the patient and family. After consideration of risks, benefits and other options for treatment, the patient has consented to  Procedure(s): RIGHT ENDOSCOPIC CARPAL TUNNEL RELEASE  (Right) as a surgical intervention .  The patient's history has been reviewed, patient examined, no change in status, stable for surgery.  I have reviewed the patient's chart and labs.  Questions were answered to the patient's satisfaction.     THOMPSON, DAVID A.

## 2015-05-24 NOTE — Transfer of Care (Signed)
Immediate Anesthesia Transfer of Care Note  Patient: Edward Santiago  Procedure(s) Performed: Procedure(s): RIGHT ENDOSCOPIC CARPAL TUNNEL RELEASE  (Right)  Patient Location: PACU  Anesthesia Type:MAC  Level of Consciousness: awake, alert  and oriented  Airway & Oxygen Therapy: Patient Spontanous Breathing and Patient connected to face mask oxygen  Post-op Assessment: Report given to RN, Post -op Vital signs reviewed and stable and Patient moving all extremities  Post vital signs: Reviewed and stable  Last Vitals:  Filed Vitals:   05/24/15 1028  BP: 149/90  Pulse: 73  Temp: 36.6 C  Resp: 18    Complications: No apparent anesthesia complications

## 2015-05-24 NOTE — Op Note (Signed)
05/24/2015  12:16 PM  PATIENT:  Waldron Labs  54 y.o. male  PRE-OPERATIVE DIAGNOSIS:  RIGHT CARPAL TUNNEL SYNDROME  POST-OPERATIVE DIAGNOSIS:  Same  PROCEDURE:  Procedure(s): RIGHT ENDOSCOPIC CARPAL TUNNEL RELEASE   SURGEON:  Surgeon(s): Milly Jakob, MD  PHYSICIAN ASSISTANT: Morley Kos, OPA-C  ANESTHESIA:  Local / MAC  SPECIMENS:  None  DRAINS:   TLS x 1  EBL:  less than 50 mL  PREOPERATIVE INDICATIONS:  EOIN WILLDEN is a  54 y.o. male with a diagnosis of RIGHT CARPAL TUNNEL SYNDROME who failed conservative measures and elected for surgical management.    The risks benefits and alternatives were discussed with the patient preoperatively including but not limited to the risks of infection, bleeding, nerve injury, cardiopulmonary complications, the need for revision surgery, among others, and the patient verbalized understanding and consented to proceed.  OPERATIVE IMPLANTS: None  OPERATIVE FINDINGS: Tight carpal tunnel, acceptably decompressed following release  OPERATIVE PROCEDURE:  After receiving prophylactic antibiotics, the patient was escorted to the operative theatre and placed in a supine position.   A surgical "time-out" was performed during which the planned procedure, proposed operative site, and the correct patient identity were compared to the operative consent and agreement confirmed by the circulating nurse according to current facility policy.  Following application of a tourniquet to the operative extremity, the planned incisions were marked and anesthetized with a mixture of lidocaine & bupivicaine containing epinephrine.  The exposed skin was prepped with Chloraprep and draped in the usual sterile fashion.  The limb was exsanguinated with an Esmarch bandage and the tourniquet inflated to approximately 164mmHg higher than systolic BP.   The incisions were made sharply. Subcutaneous tissues were dissected with blunt and spreading dissection. At  the proximal incision, the deep forearm fascia was split in line with the skin incision the distal edge grasped with a hemostat. At the mid palmar incision, the palmar fascia was split in line with the skin incision, revealing the underlying superficial palmar arch which was visualized with loupe assisted magnification. The synovial reflector was then introduced into the proximal incision and passed through the carpal canal, uses to reflect synovium from the deep surface of the transverse carpal ligament. It was removed and replaced with the slotted cannula and blunt obturator, passing from proximal to distal and exiting the distal wound superficial to the superficial palmar arch. The obturator was removed and the camera inserted. Visualization of the ligament was acceptable. The triangle shape blade was then inserted distally, advanced to the midportion of the ligament, and there used to create a perforation in the ligament. This instrument was removed and the hooked nstrument was inserted. It was placed into the perforation in the ligament and withdrawn distally, completing transection of the distal half of the ligament. The camera was then removed and placed into the distal end of the cannula. The hooked instrument was placed into the proximal end and advanced facility to be placed into the apex of the V. which had been formed to the distal hemi-transection of the ligament. It was withdrawn proximally, completing transection of the ligament. The adequacy of the release was judged with the scope and the instruments used as a probe. All of the endoscopic instruments are removed and the adequacy of release was again judged from the proximal incision perspective with direct loupe assisted visualization. In addition, the proximal forearm fascia was split for 2 inches proximal to the proximal incision under direct visualization using a sliding scissor technique.  The tourniquet was released, the wound copiously  irrigated. A TLS drain was placed to lie alongside the nerve exiting its own skin hole distally made with some sharp trocar. The skin was then closed with 4-0 Vicryl Rapide interrupted sutures. A light dressing was applied and the balance of 10 mL of the initial anesthetic mixture was placed down the drain, and the drain was clamped to be unclamped in the recovery room.  DISPOSITION:  The patient will be discharged home today, returning in 10-15 days for re-assessment.

## 2015-05-24 NOTE — Anesthesia Preprocedure Evaluation (Addendum)
Anesthesia Evaluation  Patient identified by MRN, date of birth, ID band Patient awake    Reviewed: Allergy & Precautions, NPO status , Patient's Chart, lab work & pertinent test results  History of Anesthesia Complications Negative for: history of anesthetic complications  Airway Mallampati: I  TM Distance: >3 FB Neck ROM: Full    Dental  (+) Loose, Poor Dentition, Missing, Chipped, Dental Advisory Given   Pulmonary asthma , sleep apnea and Continuous Positive Airway Pressure Ventilation , former smoker (recently quit),    breath sounds clear to auscultation       Cardiovascular hypertension, Pt. on medications (-) angina Rhythm:Regular Rate:Normal     Neuro/Psych negative neurological ROS     GI/Hepatic negative GI ROS, Neg liver ROS,   Endo/Other  diabetes (glu 198), Oral Hypoglycemic AgentsMorbid obesity  Renal/GU negative Renal ROS     Musculoskeletal  (+) Arthritis ,   Abdominal (+) + obese,   Peds  Hematology negative hematology ROS (+)   Anesthesia Other Findings   Reproductive/Obstetrics                            Anesthesia Physical Anesthesia Plan  ASA: III  Anesthesia Plan: MAC   Post-op Pain Management:    Induction:   Airway Management Planned: Natural Airway and Simple Face Mask  Additional Equipment:   Intra-op Plan:   Post-operative Plan:   Informed Consent: I have reviewed the patients History and Physical, chart, labs and discussed the procedure including the risks, benefits and alternatives for the proposed anesthesia with the patient or authorized representative who has indicated his/her understanding and acceptance.   Dental advisory given  Plan Discussed with: CRNA and Surgeon  Anesthesia Plan Comments: (Plan routine monitors, MAC)        Anesthesia Quick Evaluation

## 2015-05-24 NOTE — Discharge Instructions (Signed)
Discharge Instructions   You have a light dressing on your hand.  You may begin gentle motion of your fingers and hand immediately, but you should not do any heavy lifting or gripping.  Elevate your hand to reduce pain & swelling of the digits.  Ice over the operative site may be helpful to reduce pain & swelling.  DO NOT USE HEAT. Pain medicine has been prescribed for you.  Use your medicine as needed over the first 48 hours, and then you can begin to taper your use. You may use Tylenol in place of your prescribed pain medication, but not IN ADDITION to it. Leave the dressing in place until the third day after your surgery and then remove it, leaving it open to air.  After the bandage has been removed you may shower, but do not soak the incision.  You may drive a car when you are off of prescription pain medications and can safely control your vehicle with both hands. We will address whether therapy will be required or not when you return to the office. You may have already made your follow-up appointment when we completed your preop visit.  If not, please call our office today or the next business day to make your return appointment for 10-15 days after surgery.   Please call [phone] during normal business hours or [phone] after hours for any problems. Including the following:  - excessive redness of the incisions - drainage for more than 4 days - fever of more than 101.5 F  *Please note that pain medications will not be refilled after hours or on weekends.   TLS Drain Instructions You have a drain tube in place to help limit the amount of blood that collects under your skin in the early post-operative period.  The amount it drains will vary from person-to-person and is dependent upon many factors.  You will have 1-2 extra drain tubes sent with you from the facility.  You should change the tube according to these instructions below when the tube is about half full.  BE SURE TO  SLIDE THE CLAMP TO THE CLOSED POSITION BEFORE CHANGING THE TUBE.    RE-OPEN THE CLAMP ONCE THE GLASS EVACUATION TUBE HAS BEEN CHANGED.  24 hours after surgery the amount of drainage will likely be negligible and it will be time to remove the tube and discard it.  Simply remove the tape that secures the flexible plastic drainage tube to the bandage and briskly pull the tube straight out.  You will likely feel a little discomfort during this process, but the tube should slide out as it is not sewn or otherwise secured directly to your body.  It is merely held in place by the bandage itself.                              If you are not clear about when your first post-op appointment is scheduled, please call the office on the next business day to inquire about it.  Please also call the office if you have any other questions [phone]).    Post Anesthesia Home Care Instructions  Activity: Get plenty of rest for the remainder of the day. A responsible adult should stay with you for 24 hours following the procedure.  For the next 24 hours, DO NOT: -Drive a car -Paediatric nurse -Drink alcoholic beverages -Take any medication unless instructed by your physician -Make any legal decisions or  decisions or sign important papers. ° °Meals: °Start with liquid foods such as gelatin or soup. Progress to regular foods as tolerated. Avoid greasy, spicy, heavy foods. If nausea and/or vomiting occur, drink only clear liquids until the nausea and/or vomiting subsides. Call your physician if vomiting continues. ° °Special Instructions/Symptoms: °Your throat may feel dry or sore from the anesthesia or the breathing tube placed in your throat during surgery. If this causes discomfort, gargle with warm salt water. The discomfort should disappear within 24 hours. ° °If you had a scopolamine patch placed behind your ear for the management of post- operative nausea and/or vomiting: ° °1. The medication in the patch is effective  for 72 hours, after which it should be removed.  Wrap patch in a tissue and discard in the trash. Wash hands thoroughly with soap and water. °2. You may remove the patch earlier than 72 hours if you experience unpleasant side effects which may include dry mouth, dizziness or visual disturbances. °3. Avoid touching the patch. Wash your hands with soap and water after contact with the patch. °  ° °

## 2015-05-24 NOTE — Anesthesia Postprocedure Evaluation (Signed)
  Anesthesia Post-op Note  Patient: Edward Santiago  Procedure(s) Performed: Procedure(s): RIGHT ENDOSCOPIC CARPAL TUNNEL RELEASE  (Right)  Patient Location: PACU  Anesthesia Type:MAC  Level of Consciousness: awake, alert , oriented and patient cooperative  Airway and Oxygen Therapy: Patient Spontanous Breathing  Post-op Pain: none  Post-op Assessment: Post-op Vital signs reviewed, Patient's Cardiovascular Status Stable, Respiratory Function Stable, Patent Airway, No signs of Nausea or vomiting and Pain level controlled              Post-op Vital Signs: Reviewed and stable  Last Vitals:  Filed Vitals:   05/24/15 1330  BP: 114/76  Pulse: 70  Temp:   Resp: 16    Complications: No apparent anesthesia complications

## 2015-05-25 ENCOUNTER — Encounter (HOSPITAL_BASED_OUTPATIENT_CLINIC_OR_DEPARTMENT_OTHER): Payer: Self-pay | Admitting: Orthopedic Surgery

## 2017-11-18 ENCOUNTER — Emergency Department (HOSPITAL_COMMUNITY): Payer: PRIVATE HEALTH INSURANCE

## 2017-11-18 ENCOUNTER — Emergency Department (HOSPITAL_COMMUNITY)
Admission: EM | Admit: 2017-11-18 | Discharge: 2017-11-18 | Disposition: A | Discharge status: Home / Self Care | Payer: PRIVATE HEALTH INSURANCE | Attending: Emergency Medicine | Admitting: Emergency Medicine

## 2017-11-18 ENCOUNTER — Other Ambulatory Visit: Payer: Self-pay

## 2017-11-18 ENCOUNTER — Encounter (HOSPITAL_COMMUNITY): Payer: Self-pay | Admitting: Emergency Medicine

## 2017-11-18 DIAGNOSIS — I10 Essential (primary) hypertension: Secondary | ICD-10-CM | POA: Insufficient documentation

## 2017-11-18 DIAGNOSIS — Z79899 Other long term (current) drug therapy: Secondary | ICD-10-CM | POA: Diagnosis not present

## 2017-11-18 DIAGNOSIS — N5089 Other specified disorders of the male genital organs: Secondary | ICD-10-CM | POA: Diagnosis not present

## 2017-11-18 DIAGNOSIS — Z87891 Personal history of nicotine dependence: Secondary | ICD-10-CM | POA: Diagnosis not present

## 2017-11-18 DIAGNOSIS — E119 Type 2 diabetes mellitus without complications: Secondary | ICD-10-CM | POA: Insufficient documentation

## 2017-11-18 DIAGNOSIS — R319 Hematuria, unspecified: Secondary | ICD-10-CM | POA: Insufficient documentation

## 2017-11-18 DIAGNOSIS — Z7984 Long term (current) use of oral hypoglycemic drugs: Secondary | ICD-10-CM | POA: Diagnosis not present

## 2017-11-18 DIAGNOSIS — N50811 Right testicular pain: Secondary | ICD-10-CM | POA: Diagnosis present

## 2017-11-18 LAB — BASIC METABOLIC PANEL
ANION GAP: 9 (ref 5–15)
BUN: 16 mg/dL (ref 6–20)
CALCIUM: 9.4 mg/dL (ref 8.9–10.3)
CO2: 27 mmol/L (ref 22–32)
Chloride: 105 mmol/L (ref 101–111)
Creatinine, Ser: 0.82 mg/dL (ref 0.61–1.24)
GFR calc non Af Amer: >60 mL/min (ref >60)
Glucose, Bld: 173 mg/dL — ABNORMAL HIGH (ref 65–99)
POTASSIUM: 4 mmol/L (ref 3.5–5.1)
Sodium: 141 mmol/L (ref 135–145)

## 2017-11-18 LAB — CBC
HEMATOCRIT: 43.6 % (ref 39.0–52.0)
HEMOGLOBIN: 14.3 g/dL (ref 13.0–17.0)
MCH: 28.7 pg (ref 26.0–34.0)
MCHC: 32.8 g/dL (ref 30.0–36.0)
MCV: 87.4 fL (ref 78.0–100.0)
Platelets: 255 10*3/uL (ref 150–400)
RBC: 4.99 MIL/uL (ref 4.22–5.81)
RDW: 14.2 % (ref 11.5–15.5)
WBC: 7.4 10*3/uL (ref 4.0–10.5)

## 2017-11-18 LAB — URINALYSIS, ROUTINE W REFLEX MICROSCOPIC
Bacteria, UA: NONE SEEN
Bilirubin Urine: NEGATIVE
Glucose, UA: NEGATIVE mg/dL
KETONES UR: NEGATIVE mg/dL
Leukocytes, UA: NEGATIVE
Nitrite: NEGATIVE
Protein, ur: NEGATIVE mg/dL
SQUAMOUS EPITHELIAL / LPF: NONE SEEN
Specific Gravity, Urine: 1.015 (ref 1.005–1.030)
pH: 6 (ref 5.0–8.0)

## 2017-11-18 MED ORDER — IOPAMIDOL (ISOVUE-300) INJECTION 61%
100.0000 mL | Freq: Once | INTRAVENOUS | Status: AC | PRN
  Administered 2017-11-18: 100 mL via INTRAVENOUS

## 2017-11-18 MED ORDER — IOPAMIDOL (ISOVUE-300) INJECTION 61%
INTRAVENOUS | Status: AC
  Filled 2017-11-18: qty 100

## 2017-11-18 NOTE — ED Triage Notes (Signed)
Patient c/o swelling to penis, right testicle and right groin x5 days. Reports swelling worsens throughout the day. Denies difficulty and changes in urination.

## 2017-11-18 NOTE — ED Notes (Signed)
ED Provider at bedside. 

## 2017-11-18 NOTE — Discharge Instructions (Addendum)
Your blood work, CT scan and ultrasound were reassuring.   Please follow up with urology as we discussed.   Your blood sugar and blood pressure were elevated in the ER today, please have this rechecked by your regular doctor.
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
## 2018-08-30 ENCOUNTER — Emergency Department (HOSPITAL_COMMUNITY): Payer: BLUE CROSS/BLUE SHIELD

## 2018-08-30 ENCOUNTER — Encounter (HOSPITAL_COMMUNITY)
Admission: EM | Disposition: A | Discharge status: Home / Self Care | Payer: Self-pay | Source: Home / Self Care | Attending: Emergency Medicine

## 2018-08-30 ENCOUNTER — Emergency Department (HOSPITAL_COMMUNITY): Payer: BLUE CROSS/BLUE SHIELD | Admitting: Anesthesiology

## 2018-08-30 ENCOUNTER — Encounter (HOSPITAL_COMMUNITY): Payer: Self-pay

## 2018-08-30 ENCOUNTER — Ambulatory Visit (HOSPITAL_COMMUNITY)
Admission: EM | Admit: 2018-08-30 | Discharge: 2018-08-31 | Disposition: A | Discharge status: Home / Self Care | Payer: BLUE CROSS/BLUE SHIELD | Attending: Emergency Medicine | Admitting: Emergency Medicine

## 2018-08-30 DIAGNOSIS — S68623A Partial traumatic transphalangeal amputation of left middle finger, initial encounter: Secondary | ICD-10-CM | POA: Insufficient documentation

## 2018-08-30 DIAGNOSIS — W230XXA Caught, crushed, jammed, or pinched between moving objects, initial encounter: Secondary | ICD-10-CM | POA: Diagnosis not present

## 2018-08-30 DIAGNOSIS — Z791 Long term (current) use of non-steroidal anti-inflammatories (NSAID): Secondary | ICD-10-CM | POA: Insufficient documentation

## 2018-08-30 DIAGNOSIS — J45909 Unspecified asthma, uncomplicated: Secondary | ICD-10-CM | POA: Insufficient documentation

## 2018-08-30 DIAGNOSIS — E785 Hyperlipidemia, unspecified: Secondary | ICD-10-CM | POA: Diagnosis not present

## 2018-08-30 DIAGNOSIS — Z23 Encounter for immunization: Secondary | ICD-10-CM | POA: Insufficient documentation

## 2018-08-30 DIAGNOSIS — Z79899 Other long term (current) drug therapy: Secondary | ICD-10-CM | POA: Insufficient documentation

## 2018-08-30 DIAGNOSIS — G4700 Insomnia, unspecified: Secondary | ICD-10-CM | POA: Insufficient documentation

## 2018-08-30 DIAGNOSIS — S62633A Displaced fracture of distal phalanx of left middle finger, initial encounter for closed fracture: Secondary | ICD-10-CM | POA: Diagnosis present

## 2018-08-30 DIAGNOSIS — M1990 Unspecified osteoarthritis, unspecified site: Secondary | ICD-10-CM | POA: Diagnosis not present

## 2018-08-30 DIAGNOSIS — G4733 Obstructive sleep apnea (adult) (pediatric): Secondary | ICD-10-CM | POA: Insufficient documentation

## 2018-08-30 DIAGNOSIS — I10 Essential (primary) hypertension: Secondary | ICD-10-CM | POA: Diagnosis not present

## 2018-08-30 DIAGNOSIS — Y990 Civilian activity done for income or pay: Secondary | ICD-10-CM | POA: Diagnosis not present

## 2018-08-30 DIAGNOSIS — E119 Type 2 diabetes mellitus without complications: Secondary | ICD-10-CM | POA: Insufficient documentation

## 2018-08-30 DIAGNOSIS — Y9289 Other specified places as the place of occurrence of the external cause: Secondary | ICD-10-CM | POA: Diagnosis not present

## 2018-08-30 DIAGNOSIS — Z85828 Personal history of other malignant neoplasm of skin: Secondary | ICD-10-CM | POA: Insufficient documentation

## 2018-08-30 DIAGNOSIS — Z7984 Long term (current) use of oral hypoglycemic drugs: Secondary | ICD-10-CM | POA: Insufficient documentation

## 2018-08-30 DIAGNOSIS — Z87891 Personal history of nicotine dependence: Secondary | ICD-10-CM | POA: Insufficient documentation

## 2018-08-30 HISTORY — PX: AMPUTATION: SHX166

## 2018-08-30 LAB — CBC WITH DIFFERENTIAL/PLATELET
Abs Immature Granulocytes: 0.04 10*3/uL (ref 0.00–0.07)
BASOS ABS: 0.1 10*3/uL (ref 0.0–0.1)
BASOS PCT: 1 %
EOS PCT: 2 %
Eosinophils Absolute: 0.2 10*3/uL (ref 0.0–0.5)
HEMATOCRIT: 48.1 % (ref 39.0–52.0)
Hemoglobin: 15.8 g/dL (ref 13.0–17.0)
IMMATURE GRANULOCYTES: 0 %
LYMPHS PCT: 20 %
Lymphs Abs: 1.8 10*3/uL (ref 0.7–4.0)
MCH: 28.4 pg (ref 26.0–34.0)
MCHC: 32.8 g/dL (ref 30.0–36.0)
MCV: 86.4 fL (ref 80.0–100.0)
MONO ABS: 0.7 10*3/uL (ref 0.1–1.0)
Monocytes Relative: 8 %
NEUTROS ABS: 6.3 10*3/uL (ref 1.7–7.7)
NEUTROS PCT: 69 %
Platelets: 269 10*3/uL (ref 150–400)
RBC: 5.57 MIL/uL (ref 4.22–5.81)
RDW: 12.4 % (ref 11.5–15.5)
WBC: 9.1 10*3/uL (ref 4.0–10.5)
nRBC: 0 % (ref 0.0–0.2)

## 2018-08-30 LAB — BASIC METABOLIC PANEL
ANION GAP: 10 (ref 5–15)
BUN: 20 mg/dL (ref 6–20)
CALCIUM: 9.1 mg/dL (ref 8.9–10.3)
CO2: 22 mmol/L (ref 22–32)
CREATININE: 0.94 mg/dL (ref 0.61–1.24)
Chloride: 102 mmol/L (ref 98–111)
Glucose, Bld: 266 mg/dL — ABNORMAL HIGH (ref 70–99)
POTASSIUM: 4 mmol/L (ref 3.5–5.1)
SODIUM: 134 mmol/L — AB (ref 135–145)

## 2018-08-30 LAB — TYPE AND SCREEN
ABO/RH(D): O POS
Antibody Screen: NEGATIVE

## 2018-08-30 LAB — GLUCOSE, CAPILLARY: Glucose-Capillary: 256 mg/dL — ABNORMAL HIGH (ref 70–99)

## 2018-08-30 SURGERY — AMPUTATION DIGIT
Anesthesia: General | Site: Finger | Laterality: Left

## 2018-08-30 MED ORDER — MIDAZOLAM HCL 2 MG/2ML IJ SOLN: 0.5000 mg | Freq: Once | INTRAMUSCULAR | Status: DC | PRN

## 2018-08-30 MED ORDER — PHENYLEPHRINE HCL 10 MG/ML IJ SOLN
INTRAMUSCULAR | Status: DC | PRN
  Administered 2018-08-30: 40 ug via INTRAVENOUS
  Administered 2018-08-30: 80 ug via INTRAVENOUS
  Administered 2018-08-30: 40 ug via INTRAVENOUS
  Administered 2018-08-30: 80 ug via INTRAVENOUS

## 2018-08-30 MED ORDER — HYDROCODONE-ACETAMINOPHEN 7.5-325 MG PO TABS: 1.0000 | ORAL_TABLET | ORAL | 0 refills | Status: AC | PRN

## 2018-08-30 MED ORDER — PROMETHAZINE HCL 25 MG/ML IJ SOLN: 6.2500 mg | INTRAMUSCULAR | Status: DC | PRN

## 2018-08-30 MED ORDER — LIDOCAINE HCL 2 % IJ SOLN
10.0000 mL | Freq: Once | INTRAMUSCULAR | Status: DC
  Filled 2018-08-30: qty 20

## 2018-08-30 MED ORDER — MEPERIDINE HCL 50 MG/ML IJ SOLN: 6.2500 mg | INTRAMUSCULAR | Status: DC | PRN

## 2018-08-30 MED ORDER — BUPIVACAINE HCL (PF) 0.5 % IJ SOLN
INTRAMUSCULAR | Status: AC
  Filled 2018-08-30: qty 30

## 2018-08-30 MED ORDER — CEPHALEXIN 500 MG PO CAPS: 500.0000 mg | ORAL_CAPSULE | Freq: Four times a day (QID) | ORAL | 0 refills | Status: AC

## 2018-08-30 MED ORDER — LIDOCAINE 2% (20 MG/ML) 5 ML SYRINGE
INTRAMUSCULAR | Status: DC | PRN
  Administered 2018-08-30: 20 mg via INTRAVENOUS

## 2018-08-30 MED ORDER — MIDAZOLAM HCL 2 MG/2ML IJ SOLN
INTRAMUSCULAR | Status: AC
  Filled 2018-08-30: qty 2

## 2018-08-30 MED ORDER — PROPOFOL 10 MG/ML IV BOLUS
INTRAVENOUS | Status: DC | PRN
  Administered 2018-08-30: 30 mg via INTRAVENOUS
  Administered 2018-08-30: 20 mg via INTRAVENOUS
  Administered 2018-08-30: 200 mg via INTRAVENOUS

## 2018-08-30 MED ORDER — DEXAMETHASONE SODIUM PHOSPHATE 4 MG/ML IJ SOLN
INTRAMUSCULAR | Status: DC | PRN
  Administered 2018-08-30: 4 mg via INTRAVENOUS

## 2018-08-30 MED ORDER — BUPIVACAINE HCL (PF) 0.5 % IJ SOLN
INTRAMUSCULAR | Status: DC | PRN
  Administered 2018-08-30: 10 mL

## 2018-08-30 MED ORDER — SUCCINYLCHOLINE CHLORIDE 20 MG/ML IJ SOLN
INTRAMUSCULAR | Status: DC | PRN
  Administered 2018-08-30: 150 mg via INTRAVENOUS

## 2018-08-30 MED ORDER — FENTANYL CITRATE (PF) 100 MCG/2ML IJ SOLN: 25.0000 ug | INTRAMUSCULAR | Status: DC | PRN

## 2018-08-30 MED ORDER — FENTANYL CITRATE (PF) 250 MCG/5ML IJ SOLN
INTRAMUSCULAR | Status: AC
  Filled 2018-08-30: qty 5

## 2018-08-30 MED ORDER — ONDANSETRON HCL 4 MG/2ML IJ SOLN
INTRAMUSCULAR | Status: DC | PRN
  Administered 2018-08-30: 4 mg via INTRAVENOUS

## 2018-08-30 MED ORDER — BACITRACIN ZINC 500 UNIT/GM EX OINT
TOPICAL_OINTMENT | CUTANEOUS | Status: AC
  Filled 2018-08-30: qty 28.35

## 2018-08-30 MED ORDER — PROPOFOL 10 MG/ML IV BOLUS
INTRAVENOUS | Status: AC
  Filled 2018-08-30: qty 20

## 2018-08-30 MED ORDER — BACITRACIN ZINC 500 UNIT/GM EX OINT
TOPICAL_OINTMENT | CUTANEOUS | Status: DC | PRN
  Administered 2018-08-30: 1 via TOPICAL

## 2018-08-30 MED ORDER — CEFAZOLIN SODIUM-DEXTROSE 1-4 GM/50ML-% IV SOLN
INTRAVENOUS | Status: DC | PRN
  Administered 2018-08-30: 1 g via INTRAVENOUS

## 2018-08-30 MED ORDER — TETANUS-DIPHTH-ACELL PERTUSSIS 5-2.5-18.5 LF-MCG/0.5 IM SUSP
0.5000 mL | Freq: Once | INTRAMUSCULAR | Status: AC
  Administered 2018-08-30: 0.5 mL via INTRAMUSCULAR
  Filled 2018-08-30: qty 0.5

## 2018-08-30 MED ORDER — OXYCODONE HCL 5 MG PO TABS: 5.0000 mg | ORAL_TABLET | Freq: Once | ORAL | Status: DC | PRN

## 2018-08-30 MED ORDER — CEFAZOLIN SODIUM-DEXTROSE 1-4 GM/50ML-% IV SOLN
1.0000 g | Freq: Once | INTRAVENOUS | Status: AC
  Administered 2018-08-30: 1 g via INTRAVENOUS
  Filled 2018-08-30: qty 50

## 2018-08-30 MED ORDER — LACTATED RINGERS IV SOLN
INTRAVENOUS | Status: DC | PRN
  Administered 2018-08-30: 23:00:00 via INTRAVENOUS

## 2018-08-30 MED ORDER — FENTANYL CITRATE (PF) 100 MCG/2ML IJ SOLN
INTRAMUSCULAR | Status: DC | PRN
  Administered 2018-08-30: 25 ug via INTRAVENOUS
  Administered 2018-08-30: 50 ug via INTRAVENOUS

## 2018-08-30 MED ORDER — MORPHINE SULFATE (PF) 4 MG/ML IV SOLN: 4.0000 mg | Freq: Once | INTRAVENOUS | Status: DC

## 2018-08-30 MED ORDER — OXYCODONE HCL 5 MG/5ML PO SOLN: 5.0000 mg | Freq: Once | ORAL | Status: DC | PRN

## 2018-08-30 SURGICAL SUPPLY — 24 items
BANDAGE COBAN STERILE 2 (GAUZE/BANDAGES/DRESSINGS) IMPLANT
BNDG CMPR 9X4 STRL LF SNTH (GAUZE/BANDAGES/DRESSINGS) ×1
BNDG COHESIVE 1X5 TAN STRL LF (GAUZE/BANDAGES/DRESSINGS) ×2 IMPLANT
BNDG ESMARK 4X9 LF (GAUZE/BANDAGES/DRESSINGS) ×3 IMPLANT
CORDS BIPOLAR (ELECTRODE) ×3 IMPLANT
DRSG XEROFORM 1X8 (GAUZE/BANDAGES/DRESSINGS) ×2 IMPLANT
GAUZE SPONGE 4X4 12PLY STRL (GAUZE/BANDAGES/DRESSINGS) ×3 IMPLANT
GAUZE SPONGE 4X4 12PLY STRL LF (GAUZE/BANDAGES/DRESSINGS) ×2 IMPLANT
GAUZE XEROFORM 1X8 LF (GAUZE/BANDAGES/DRESSINGS) ×3 IMPLANT
GLOVE BIO SURGEON STRL SZ8 (GLOVE) ×2 IMPLANT
GLOVE BIOGEL M 8.0 STRL (GLOVE) ×5 IMPLANT
GOWN STRL REUS W/ TWL XL LVL3 (GOWN DISPOSABLE) ×1 IMPLANT
GOWN STRL REUS W/TWL XL LVL3 (GOWN DISPOSABLE) ×6
KIT BASIN OR (CUSTOM PROCEDURE TRAY) ×3 IMPLANT
KIT TURNOVER KIT B (KITS) ×3 IMPLANT
NS IRRIG 1000ML POUR BTL (IV SOLUTION) ×3 IMPLANT
PACK ORTHO EXTREMITY (CUSTOM PROCEDURE TRAY) ×3 IMPLANT
PAD ARMBOARD 7.5X6 YLW CONV (MISCELLANEOUS) ×6 IMPLANT
SOAP 2 % CHG 4 OZ (WOUND CARE) ×3 IMPLANT
SYR CONTROL 10ML LL (SYRINGE) ×2 IMPLANT
TOWEL OR 17X24 6PK STRL BLUE (TOWEL DISPOSABLE) ×3 IMPLANT
TOWEL OR 17X26 10 PK STRL BLUE (TOWEL DISPOSABLE) ×3 IMPLANT
TUBE CONNECTING 12'X1/4 (SUCTIONS) ×1
TUBE CONNECTING 12X1/4 (SUCTIONS) ×1 IMPLANT

## 2018-08-30 NOTE — Anesthesia Preprocedure Evaluation (Addendum)
Anesthesia Evaluation  Patient identified by MRN, date of birth, ID band Patient awake    Reviewed: Allergy & Precautions, NPO status , Patient's Chart, lab work & pertinent test results  History of Anesthesia Complications Negative for: history of anesthetic complications  Airway Mallampati: II  TM Distance: <3 FB Neck ROM: Full    Dental  (+) Dental Advisory Given, Missing, Chipped, Poor Dentition   Pulmonary sleep apnea and Continuous Positive Airway Pressure Ventilation , COPD, former smoker (quit 2016),    breath sounds clear to auscultation       Cardiovascular hypertension, Pt. on medications (-) angina Rhythm:Regular Rate:Normal     Neuro/Psych Anxiety    GI/Hepatic negative GI ROS, Neg liver ROS,   Endo/Other  diabetes (glu 266), Oral Hypoglycemic Agents  Renal/GU negative Renal ROS     Musculoskeletal   Abdominal (+) + obese,   Peds  Hematology negative hematology ROS (+)   Anesthesia Other Findings   Reproductive/Obstetrics                           Anesthesia Physical Anesthesia Plan  ASA: III and emergent  Anesthesia Plan: General   Post-op Pain Management:    Induction: Intravenous, Rapid sequence and Cricoid pressure planned  PONV Risk Score and Plan: 2 and Ondansetron and Dexamethasone  Airway Management Planned: Oral ETT  Additional Equipment:   Intra-op Plan:   Post-operative Plan: Extubation in OR  Informed Consent: I have reviewed the patients History and Physical, chart, labs and discussed the procedure including the risks, benefits and alternatives for the proposed anesthesia with the patient or authorized representative who has indicated his/her understanding and acceptance.   Dental advisory given  Plan Discussed with: CRNA and Surgeon  Anesthesia Plan Comments: (Plan routine monitors, GETA)        Anesthesia Quick Evaluation
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
## 2018-08-30 NOTE — ED Notes (Signed)
Report given to short stay.  Will transport now.

## 2018-08-30 NOTE — Transfer of Care (Signed)
Immediate Anesthesia Transfer of Care Note  Patient: Edward Santiago  Procedure(s) Performed: REVISION OF AMPUTATION, LEFT LONG FINGER (Left Finger)  Patient Location: PACU  Anesthesia Type:General  Level of Consciousness: awake, alert  and oriented  Airway & Oxygen Therapy: Patient Spontanous Breathing and Patient connected to nasal cannula oxygen  Post-op Assessment: Report given to RN and Post -op Vital signs reviewed and stable  Post vital signs: Reviewed and stable  Last Vitals:  Vitals Value Taken Time  BP 105/73 08/30/2018 11:37 PM  Temp    Pulse 86 08/30/2018 11:40 PM  Resp 18 08/30/2018 11:40 PM  SpO2 96 % 08/30/2018 11:40 PM  Vitals shown include unvalidated device data.  Last Pain:  Vitals:   08/30/18 2205  PainSc: 6       Patients Stated Pain Goal: 6 (54/88/45 7334)  Complications: No apparent anesthesia complications
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
## 2018-08-30 NOTE — Anesthesia Procedure Notes (Signed)
Procedure Name: Intubation Date/Time: 08/30/2018 10:49 PM Performed by: Suzy Bouchard, CRNA Pre-anesthesia Checklist: Patient identified, Emergency Drugs available, Patient being monitored, Timeout performed and Suction available Patient Re-evaluated:Patient Re-evaluated prior to induction Oxygen Delivery Method: Circle system utilized Preoxygenation: Pre-oxygenation with 100% oxygen Induction Type: IV induction, Rapid sequence and Cricoid Pressure applied Laryngoscope Size: Miller and 2 Tube type: Oral Tube size: 7.5 mm Number of attempts: 1 Airway Equipment and Method: Stylet Placement Confirmation: ETT inserted through vocal cords under direct vision,  positive ETCO2 and breath sounds checked- equal and bilateral Secured at: 24 cm Tube secured with: Tape Dental Injury: Teeth and Oropharynx as per pre-operative assessment

## 2018-08-31 ENCOUNTER — Encounter (HOSPITAL_COMMUNITY): Payer: Self-pay | Admitting: General Surgery

## 2018-08-31 NOTE — Discharge Instructions (Signed)
Finger Fracture, Adult A finger fracture is a break in any of the bones in your fingers. Your doctor may put a splint on your finger so it will not move while it heals (immobilization). Follow these instructions at home: If you have a splint:   Wear the splint as told by your doctor. Remove it only as told by your doctor.  Do not put pressure on any part of the splint until it is fully hardened. This may take several hours.  Loosen the splint if your fingers tingle, lose feeling (get numb), or turn cold and blue.  Keep the splint clean.  If the splint is not waterproof: ? Do not let it get wet. ? Cover it with a watertight covering when you take a bath or a shower.  Ask your doctor when it is safe for you to drive. Managing pain, stiffness, and swelling  If directed, put ice on the injured area: ? If you have a removable splint, remove it as told by your doctor. ? Put ice in a plastic bag. ? Place a towel between your skin and the bag. ? Leave the ice on for 20 minutes, 2-3 times a day.  Move your fingers often to avoid stiffness and to lessen swelling.  Raise (elevate) the injured area above the level of your heart while you are sitting or lying down. Activity  Do not drive or use heavy machinery while taking prescription pain medicine.  Do exercises (physical therapy) as told by your doctor.  Return to your normal activities as told by your doctor. Ask your doctor what activities are safe for you. General instructions  Do not use any products that have nicotine or tobacco in them, such as cigarettes and e-cigarettes. These can delay bone healing. If you need help quitting, ask your doctor.  Take over-the-counter and prescription medicines only as told by your doctor.  Keep all follow-up visits as told by your doctor. This is important. Contact a doctor if:  Your pain or swelling gets worse, even with treatment.  You have trouble moving your finger. Get help  right away if:  Your finger gets numb or turns blue. Summary  A finger fracture is a break in any of the bones in your fingers.  You may need to wear a splint on your finger so it will not move while it heals (immobilization).  If directed, put ice on the injured area for 20 minutes, 2-3 times a day. This information is not intended to replace advice given to you by your health care provider. Make sure you discuss any questions you have with your health care provider. Document Released: 01/30/2008 Document Revised: 03/27/2017 Document Reviewed: 03/27/2017 Elsevier Interactive Patient Education  2019 [employer]. Discharge Instructions:  Keep your dressing clean, dry and in place until instructed to remove by Dr. Lenon Curt.  If the dressing becomes dirty or wet call the office for instructions during business hours. Elevate the extremity to help with swelling, this will also help with any discomfort. Take your medication as prescribed. No lifting with the injured  extremity. If you feel that the dressing is too tight, you may loosen it, but keep it on; finger tips should be pink; if there is a concern, call the office. [phone] Ice may be used if the injury is a fracture, do not apply ice directly to the skin. Please call the office on the next business day after discharge to arrange a follow up appointment.  Call [phone] between the hours of 9am - 5pm M-Th or 9am - 1pm on Fri. For most hand injuries and/or conditions, you may return to work using the uninjured hand (one handed duty) within 24-72 hours.  A detailed note will be provided to you at your follow up appointment or may contact the office prior to your follow up.

## 2018-08-31 NOTE — Anesthesia Postprocedure Evaluation (Addendum)
Anesthesia Post Note  Patient: Edward Santiago  Procedure(s) Performed: REVISION OF AMPUTATION, LEFT LONG FINGER (Left Finger)     Patient location during evaluation: PACU Anesthesia Type: General Level of consciousness: awake and alert, patient cooperative and oriented Pain management: pain level controlled Vital Signs Assessment: post-procedure vital signs reviewed and stable Respiratory status: spontaneous breathing, nonlabored ventilation and respiratory function stable Cardiovascular status: blood pressure returned to baseline and stable Postop Assessment: no apparent nausea or vomiting and adequate PO intake Anesthetic complications: no Comments: Pt uses his CPAP each night, and understands importance of using it post op and while needing pain medication    Last Vitals:  Vitals:   08/31/18 0015 08/31/18 0022  BP: (!) 111/51 109/76  Pulse: 81 77  Resp: 15 18  Temp: 36.5 C 36.5 C  SpO2: 93% 93%    Last Pain:  Vitals:   08/30/18 2345  PainSc: 0-No pain                 Toribio Seiber,E. Jayni Prescher
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
